# Patient Record
Sex: Female | Born: 1995 | Race: White | Hispanic: No | Marital: Single | Smoking: Current every day smoker
Health system: Southern US, Community
[De-identification: ages and names within clinical notes are randomized; demographics above are authoritative.]

## PROBLEM LIST (undated history)

## (undated) ENCOUNTER — Inpatient Hospital Stay (HOSPITAL_COMMUNITY): Payer: Self-pay

## (undated) DIAGNOSIS — Z789 Other specified health status: Secondary | ICD-10-CM

---

## 2017-04-26 ENCOUNTER — Ambulatory Visit (HOSPITAL_COMMUNITY)
Admission: EM | Admit: 2017-04-26 | Discharge: 2017-04-27 | Disposition: A | Discharge status: Home / Self Care | Payer: Medicaid Other | Attending: Family Medicine | Admitting: Family Medicine

## 2017-04-26 ENCOUNTER — Encounter (HOSPITAL_COMMUNITY): Payer: Self-pay | Admitting: Emergency Medicine

## 2017-04-26 DIAGNOSIS — R9389 Abnormal findings on diagnostic imaging of other specified body structures: Secondary | ICD-10-CM | POA: Insufficient documentation

## 2017-04-26 DIAGNOSIS — O209 Hemorrhage in early pregnancy, unspecified: Secondary | ICD-10-CM

## 2017-04-26 DIAGNOSIS — O071 Delayed or excessive hemorrhage following failed attempted termination of pregnancy: Secondary | ICD-10-CM

## 2017-04-26 DIAGNOSIS — D62 Acute posthemorrhagic anemia: Secondary | ICD-10-CM | POA: Insufficient documentation

## 2017-04-26 DIAGNOSIS — O26892 Other specified pregnancy related conditions, second trimester: Secondary | ICD-10-CM | POA: Insufficient documentation

## 2017-04-26 DIAGNOSIS — IMO0002 Reserved for concepts with insufficient information to code with codable children: Secondary | ICD-10-CM

## 2017-04-26 DIAGNOSIS — O031 Delayed or excessive hemorrhage following incomplete spontaneous abortion: Secondary | ICD-10-CM | POA: Diagnosis not present

## 2017-04-26 DIAGNOSIS — O219 Vomiting of pregnancy, unspecified: Secondary | ICD-10-CM | POA: Diagnosis not present

## 2017-04-26 DIAGNOSIS — F17200 Nicotine dependence, unspecified, uncomplicated: Secondary | ICD-10-CM | POA: Diagnosis not present

## 2017-04-26 DIAGNOSIS — O99332 Smoking (tobacco) complicating pregnancy, second trimester: Secondary | ICD-10-CM | POA: Insufficient documentation

## 2017-04-26 DIAGNOSIS — Z3A18 18 weeks gestation of pregnancy: Secondary | ICD-10-CM | POA: Insufficient documentation

## 2017-04-26 HISTORY — DX: Other specified health status: Z78.9

## 2017-04-26 LAB — COMPREHENSIVE METABOLIC PANEL
ALT: 9 U/L — ABNORMAL LOW (ref 14–54)
AST: 14 U/L — AB (ref 15–41)
Albumin: 3.7 g/dL (ref 3.5–5.0)
Alkaline Phosphatase: 78 U/L (ref 38–126)
Anion gap: 10 (ref 5–15)
BILIRUBIN TOTAL: 0.6 mg/dL (ref 0.3–1.2)
BUN: 14 mg/dL (ref 6–20)
CHLORIDE: 104 mmol/L (ref 101–111)
CO2: 23 mmol/L (ref 22–32)
Calcium: 9.3 mg/dL (ref 8.9–10.3)
Creatinine, Ser: 0.63 mg/dL (ref 0.44–1.00)
GFR calc Af Amer: >60 mL/min (ref >60)
GLUCOSE: 134 mg/dL — AB (ref 65–99)
POTASSIUM: 3.2 mmol/L — AB (ref 3.5–5.1)
Sodium: 137 mmol/L (ref 135–145)
TOTAL PROTEIN: 7.1 g/dL (ref 6.5–8.1)

## 2017-04-26 LAB — CBC
HCT: 22.5 % — ABNORMAL LOW (ref 36.0–46.0)
HEMATOCRIT: 31.4 % — AB (ref 36.0–46.0)
HEMOGLOBIN: 7.3 g/dL — AB (ref 12.0–15.0)
Hemoglobin: 9.9 g/dL — ABNORMAL LOW (ref 12.0–15.0)
MCH: 25.1 pg — ABNORMAL LOW (ref 26.0–34.0)
MCH: 25.7 pg — AB (ref 26.0–34.0)
MCHC: 31.5 g/dL (ref 30.0–36.0)
MCHC: 32.4 g/dL (ref 30.0–36.0)
MCV: 79.5 fL (ref 78.0–100.0)
MCV: 79.2 fL (ref 78.0–100.0)
PLATELETS: 410 10*3/uL — AB (ref 150–400)
Platelets: 290 10*3/uL (ref 150–400)
RBC: 3.95 MIL/uL (ref 3.87–5.11)
RBC: 2.84 MIL/uL — AB (ref 3.87–5.11)
RDW: 16.1 % — AB (ref 11.5–15.5)
RDW: 16.4 % — ABNORMAL HIGH (ref 11.5–15.5)
WBC: 11.3 10*3/uL — AB (ref 4.0–10.5)
WBC: 13.4 10*3/uL — ABNORMAL HIGH (ref 4.0–10.5)

## 2017-04-26 LAB — TYPE AND SCREEN
ABO/RH(D): AB POS
Antibody Screen: NEGATIVE

## 2017-04-26 LAB — I-STAT BETA HCG BLOOD, ED (MC, WL, AP ONLY): I-stat hCG, quantitative: 87.2 m[IU]/mL — ABNORMAL HIGH (ref <5)

## 2017-04-26 MED ORDER — KETOROLAC TROMETHAMINE 30 MG/ML IJ SOLN
30.0000 mg | Freq: Once | INTRAMUSCULAR | Status: AC
  Administered 2017-04-27: 30 mg via INTRAVENOUS
  Filled 2017-04-26: qty 1

## 2017-04-26 MED ORDER — SODIUM CHLORIDE 0.9 % IV BOLUS (SEPSIS)
1000.0000 mL | Freq: Once | INTRAVENOUS | Status: AC
Start: 2017-04-26 — End: 2017-04-26
  Administered 2017-04-26: 1000 mL via INTRAVENOUS

## 2017-04-26 MED ORDER — MORPHINE SULFATE (PF) 4 MG/ML IV SOLN
INTRAVENOUS | Status: AC
  Administered 2017-04-26: 4 mg
  Filled 2017-04-26: qty 3

## 2017-04-26 MED ORDER — OXYCODONE-ACETAMINOPHEN 5-325 MG PO TABS
1.0000 | ORAL_TABLET | ORAL | Status: DC | PRN
  Administered 2017-04-26: 1 via ORAL
  Filled 2017-04-26: qty 1

## 2017-04-26 NOTE — MAU Note (Signed)
[facility] with [facility] bleeding. Pt reports having TAB about 2 weeks prior. States she was about [redacted] weeks pregnant at that time. Pt reports heavy vaginal bleeding started tonight around 8pm and passing large clots the size of baseballs or larger. Pt reports abdominal pain and cramping that she rates 10/10.

## 2017-04-26 NOTE — ED Triage Notes (Signed)
Patient presents to ED for assessment of vaginal bleeding 2 weeks after an elective abortion.  Patient states she had one day of bleeding and then 24 hours after she began bleeding again.  Has been bleeding ever since, patient states today she began having large clots, lower abdominal cramping, and filling the bowl with blood.

## 2017-04-26 NOTE — ED Notes (Signed)
[facility] called per [facility] Kidneyebra, NS

## 2017-04-26 NOTE — MAU Provider Note (Signed)
History     CSN: [id number]  Arrival date and time: 04/26/17 2026   First Provider Initiated Contact with Patient 04/26/17 2324      Chief Complaint  Patient presents with  . Vaginal Bleeding   HPI   Crystal [name] is a 22 y.o. female [id number] here in MAU with heavy vaginal bleeding. States she had an 18 week [facility] [place]. States all went well and has had no problems until tonight. Says she has been bleeding for 2 weeks, however it has been very light.  Tonight she started having heavy bleeding with baseball sized clots. Says she presented to [facility] ED tonight and was transferred here for further evaluation. States the pain is severe, cramp like in her lower abdomen; similar to contraction pain. + dizziness.   OB History    Gravida Para Term Preterm AB Living   '3 2 2 '$ 0 1 2   SAB TAB Ectopic Multiple Live Births   0 1 0 0 2      No past medical history on file.    No family history on file.  Social History   Tobacco Use  . Smoking status: Current Every Day Smoker    Packs/day: 0.50  . Smokeless tobacco: Never Used  Substance Use Topics  . Alcohol use: Yes    Comment: socially  . Drug use: No    Allergies: Not on File  No medications prior to admission.   Results for orders placed or performed during the hospital encounter of 04/26/17 (from the past 48 hour(s))  Type and screen [facility]     Status: None   Collection Time: 04/26/17  8:45 PM  Result Value Ref Range   ABO/RH(D) AB POS    Antibody Screen NEG    Sample Expiration      04/29/2017 Performed at [facility], [place] [address] [place], [place] [address]   ABO/Rh     Status: None (Preliminary result)   Collection Time: 04/26/17  8:45 PM  Result Value Ref Range   ABO/RH(D)      AB POS Performed at [facility] [address] [place], [place] [address]   Comprehensive metabolic panel     Status: Abnormal    Collection Time: 04/26/17  8:48 PM  Result Value Ref Range   Sodium 137 135 - 145 mmol/L   Potassium 3.2 (L) 3.5 - 5.1 mmol/L   Chloride 104 101 - 111 mmol/L   CO2 23 22 - 32 mmol/L   Glucose, Bld 134 (H) 65 - 99 mg/dL   BUN 14 6 - 20 mg/dL   Creatinine, Ser 0.63 0.44 - 1.00 mg/dL   Calcium 9.3 8.9 - 10.3 mg/dL   Total Protein 7.1 6.5 - 8.1 g/dL   Albumin 3.7 3.5 - 5.0 g/dL   AST 14 (L) 15 - 41 U/L   ALT 9 (L) 14 - 54 U/L   Alkaline Phosphatase 78 38 - 126 U/L   Total Bilirubin 0.6 0.3 - 1.2 mg/dL   GFR calc non Af Amer >60 >60 mL/min   GFR calc Af Amer >60 >60 mL/min    Comment: (NOTE) The eGFR has been calculated using the CKD EPI equation. This calculation has not been validated in all clinical situations. eGFR's persistently <60 mL/min signify possible Chronic Kidney Disease.    Anion gap 10 5 - 15    Comment: Performed at [facility], [place] [address] [place], [place] [address]  CBC     Status: Abnormal   Collection Time: 04/26/17  8:48 PM  Result Value Ref Range   WBC 11.3 (H) 4.0 - 10.5 K/uL   RBC 3.95 3.87 - 5.11 MIL/uL   Hemoglobin 9.9 (L) 12.0 - 15.0 g/dL   HCT 31.4 (L) 36.0 - 46.0 %   MCV 79.5 78.0 - 100.0 fL   MCH 25.1 (L) 26.0 - 34.0 pg   MCHC 31.5 30.0 - 36.0 g/dL   RDW 16.1 (H) 11.5 - 15.5 %   Platelets 410 (H) 150 - 400 K/uL    Comment: Performed at [facility] [address] [place], [place] [address]  I-Stat beta hCG blood, ED     Status: Abnormal   Collection Time: 04/26/17  8:59 PM  Result Value Ref Range   I-stat hCG, quantitative 87.2 (H) <5 mIU/mL   Comment 3            Comment:   GEST. AGE      CONC.  (mIU/mL)   <=1 WEEK        5 - 50     2 WEEKS       50 - 500     3 WEEKS       100 - 10,000     4 WEEKS     1,000 - 30,000        FEMALE AND NON-PREGNANT FEMALE:     LESS THAN 5 mIU/mL   CBC     Status: Abnormal   Collection Time: 04/26/17 11:17 PM  Result Value Ref Range   WBC 13.4 (H) 4.0 - 10.5 K/uL   RBC 2.84 (L) 3.87 -  5.11 MIL/uL   Hemoglobin 7.3 (L) 12.0 - 15.0 g/dL   HCT 22.5 (L) 36.0 - 46.0 %   MCV 79.2 78.0 - 100.0 fL   MCH 25.7 (L) 26.0 - 34.0 pg   MCHC 32.4 30.0 - 36.0 g/dL   RDW 16.4 (H) 11.5 - 15.5 %   Platelets 290 150 - 400 K/uL    Comment: Performed at [facility], [address], [place], [place] [address]  hCG, quantitative, pregnancy     Status: Abnormal   Collection Time: 04/26/17 11:17 PM  Result Value Ref Range   hCG, Beta Chain, Quant, S 67 (H) <5 mIU/mL    Comment:          GEST. AGE      CONC.  (mIU/mL)   <=1 WEEK        5 - 50     2 WEEKS       50 - 500     3 WEEKS       100 - 10,000     4 WEEKS     1,000 - 30,000     5 WEEKS     3,500 - 115,000   6-8 WEEKS     12,000 - 270,000    12 WEEKS     15,000 - 220,000        FEMALE AND NON-PREGNANT FEMALE:     LESS THAN 5 mIU/mL Performed at [facility], [address] [place], [place] [address]   Type and screen [place]     Status: None   Collection Time: 04/26/17 11:17 PM  Result Value Ref Range   ABO/RH(D) AB POS    Antibody Screen NEG    Sample Expiration  04/29/2017 Performed at [facility], [address], [place], [place] [address]    Review of Systems  Constitutional: Negative for fever.  Gastrointestinal: Positive for abdominal pain. Negative for nausea and vomiting.  Genitourinary: Positive for vaginal bleeding.  Neurological: Positive for dizziness and light-headedness.   Physical Exam   Blood pressure 96/67, pulse (!) 110, temperature 97.9 F (36.6 C), resp. rate (!) 22, height '5\' 8"'$  (1.727 m), weight 127 lb (57.6 kg), last menstrual period 10/20/2016, SpO2 100 %.   Patient Vitals for the past 24 hrs:  BP Temp Temp src Pulse Resp SpO2 Height Weight  04/27/17 0020 (!) 102/59 - - 94 - - - -  04/27/17 0010 (!) 95/57 - - 93 - - - -  04/27/17 0000 104/90 - - (!) 101 - - - -  04/26/17 2350 (!) 108/48 - - (!) 119 - - - -  04/26/17 2340 (!) 94/57 - - (!) 139 - - - -   04/26/17 2336 (!) 96/59 - - 92 - - - -  04/26/17 2313 96/67 - - (!) 110 - 100 % - -  04/26/17 2310 90/65 97.9 F (36.6 C) - (!) 124 (!) 22 100 % - -  04/26/17 2240 107/63 - - (!) 117 (!) 25 100 % - -  04/26/17 2215 100/70 - - (!) 112 15 100 % - -  04/26/17 2145 106/68 - - (!) 130 16 100 % - -  04/26/17 2115 115/70 98.5 F (36.9 C) Oral (!) 101 13 100 % - -  04/26/17 2033 - - - - - - '5\' 8"'$  (1.727 m) 127 lb (57.6 kg)  04/26/17 2032 108/64 98.2 F (36.8 C) Oral (!) 134 (!) 22 100 % - -    Physical Exam  Constitutional: She is oriented to person, place, and time. She appears well-developed and well-nourished.  Genitourinary:  Genitourinary Comments: Large amount of bright red blood noted on pad Large clots on pad noted   Musculoskeletal: Normal range of motion.  Neurological: She is alert and oriented to person, place, and time.  Skin: Skin is warm and dry. There is pallor.  Psychiatric: Her behavior is normal.   MAU Course  Procedures  None  MDM  AB positive blood type  NS bolus  CBC, Type and screen [place] - bedside  Toradol 60 mg IM given   Assessment and Plan   A:  1. Hemorrhage due to retained products of conception   2. Vaginal bleeding in pregnancy, first trimester     P:  Dr. Kennon Rounds called to MAU to discuss plan of care for the patient.    Lezlie Lye, NP 04/27/2017 12:53 AM

## 2017-04-26 NOTE — MAU Note (Signed)
[facility] for [facility]. Pt had AB 2 weeks ago. Started bleeding heavily tonight.

## 2017-04-26 NOTE — ED Provider Notes (Signed)
WOMENS PERIOPERATIVE AREA Provider Note   CSN: [id number] Arrival date & time: 04/26/17  2026     History   Chief Complaint Chief Complaint  Patient presents with  . Vaginal Bleeding    HPI Crystal [name] is a 22 y.o. female.  HPI    22 year old female with a history of D&C abortion 2 weeks ago at [facility] in [place], presents with concern for vaginal bleeding and cramping.  Patient reports she had some mild bleeding and cramping for the last 2 weeks, similar to her menses, however tonight at 8 PM, suddenly developed worsening cramping and significant bleeding.  Reports she is passing large clots of blood, and has significant pelvic cramping associated with it.  Reports the pain is a 13 out of 10.  Reports there were no known complications to her abortion.  Notes that over the last half hour, she is began to have some lightheadedness.  Denies history of being on blood thinners or history of bleeding disorder.   Past Medical History:  Diagnosis Date  . Medical history non-contributory     Patient Active Problem List   Diagnosis Date Noted  . Hemorrhage due to retained products of conception      OB History    Gravida Para Term Preterm AB Living   3 2 2  0 1 2   SAB TAB Ectopic Multiple Live Births   0 1 0 0 2       Home Medications    Prior to Admission medications   Medication Sig Start Date End Date Taking? Authorizing Provider  doxycycline (VIBRAMYCIN) 100 MG capsule Take 1 capsule (100 mg total) by mouth 2 (two) times daily. 04/27/17   Reva Bores, MD  ibuprofen (ADVIL,MOTRIN) 600 MG tablet Take 1 tablet (600 mg total) by mouth every 6 (six) hours as needed. 04/27/17   Reva Bores, MD    Family History No family history on file.  Social History Social History   Tobacco Use  . Smoking status: Current Every Day Smoker    Packs/day: 0.50  . Smokeless tobacco: Never Used  Substance Use Topics  . Alcohol use: Yes    Comment: socially  .  Drug use: No     Allergies   Patient has no known allergies.   Review of Systems Review of Systems  Unable to perform ROS: Acuity of condition  Constitutional: Negative for fever.  HENT: Negative for sore throat.   Eyes: Negative for visual disturbance.  Respiratory: Negative for cough and shortness of breath.   Cardiovascular: Negative for chest pain.  Gastrointestinal: Positive for abdominal pain and nausea. Negative for vomiting.  Genitourinary: Positive for vaginal bleeding. Negative for difficulty urinating and dysuria.  Musculoskeletal: Negative for back pain and neck pain.  Skin: Negative for rash.  Neurological: Positive for light-headedness. Negative for syncope and headaches.     Physical Exam Updated Vital Signs BP (!) 91/59   Pulse 83   Temp 97.9 F (36.6 C)   Resp 13   Ht 5\' 8"  (1.727 m)   Wt 57.6 kg (127 lb)   LMP 10/20/2016 (Within Weeks)   SpO2 100%   BMI 19.31 kg/m   Physical Exam  Constitutional: She is oriented to person, place, and time. She appears well-developed and well-nourished. No distress.  HENT:  Head: Normocephalic and atraumatic.  Eyes: Conjunctivae and EOM are normal.  Neck: Normal range of motion.  Cardiovascular: Normal rate, regular rhythm, normal heart sounds and intact distal  pulses. Exam reveals no gallop and no friction rub.  No murmur heard. Pulmonary/Chest: Effort normal and breath sounds normal. No respiratory distress. She has no wheezes. She has no rales.  Abdominal: Soft. She exhibits no distension. There is no tenderness. There is no guarding.  Genitourinary: There is bleeding (large clots, continued bleeding, unable to visualize cervix) in the vagina.  Musculoskeletal: She exhibits no edema or tenderness.  Neurological: She is alert and oriented to person, place, and time.  Skin: Skin is warm and dry. No rash noted. She is not diaphoretic. No erythema.  Nursing note and vitals reviewed.    ED Treatments / Results    Labs (all labs ordered are listed, but only abnormal results are displayed) Labs Reviewed  COMPREHENSIVE METABOLIC PANEL - Abnormal; Notable for the following components:      Result Value   Potassium 3.2 (*)    Glucose, Bld 134 (*)    AST 14 (*)    ALT 9 (*)    All other components within normal limits  CBC - Abnormal; Notable for the following components:   WBC 11.3 (*)    Hemoglobin 9.9 (*)    HCT 31.4 (*)    MCH 25.1 (*)    RDW 16.1 (*)    Platelets 410 (*)    All other components within normal limits  CBC - Abnormal; Notable for the following components:   WBC 13.4 (*)    RBC 2.84 (*)    Hemoglobin 7.3 (*)    HCT 22.5 (*)    MCH 25.7 (*)    RDW 16.4 (*)    All other components within normal limits  HCG, QUANTITATIVE, PREGNANCY - Abnormal; Notable for the following components:   hCG, Beta Chain, Quant, S 67 (*)    All other components within normal limits  I-STAT BETA HCG BLOOD, ED ([facility], [facility], [facility] ONLY) - Abnormal; Notable for the following components:   I-stat hCG, quantitative 87.2 (*)    All other components within normal limits  HIV ANTIBODY (ROUTINE TESTING)  CBC  TYPE AND SCREEN  ABO/RH  TYPE AND SCREEN  ABO/RH  SURGICAL PATHOLOGY    EKG  EKG Interpretation None       Radiology US Ob Less Than 14 Weeks With Ob Transvaginal  Result Date: 04/27/2017 CLINICAL DATA:  Status post recent failed pregnancy for 2 weeks with continued vaginal bleeding EXAM: OBSTETRIC <14 WK [place] AND TRANSVAGINAL OB US TECHNIQUE: Both transabdominal and transvaginal ultrasound examinations were performed for complete evaluation of the gestation as well as the maternal uterus, adnexal regions, and pelvic cul-de-sac. Transvaginal technique was performed to assess early pregnancy. COMPARISON:  None. FINDINGS: Intrauterine gestational sac: Not seen Yolk sac:  Not seen Embryo:  Not seen Maternal uterus/adnexae: Bilateral ovaries are within normal limits. Right ovary measures 2 by 2.7 x 1.9  cm. Left ovary measures 2.7 by 2.4 x 1.5 cm. No significant free fluid. Endometrial mass or heterogeneous echogenic thickening measuring 6 x 3.5 cm with hypervascularity. IMPRESSION: 1. No intrauterine pregnancy is visualized. By report, patient is status post TAB. 2. Heterogeneous masslike thickening of the endometrium in the fundus measuring up to 6 cm in thickness with increased vascularity, findings would be consistent with retained products of conception in the appropriate clinical setting. Electronically Signed   By: Jasmine Pang M.D.   On: 04/27/2017 00:57    Procedures .Critical Care Performed by: Alvira Monday, MD Authorized by: Alvira Monday, MD   Critical care provider  statement:    Critical care time (minutes):  30   Critical care was time spent personally by me on the following activities:  Evaluation of patient's response to treatment, discussions with consultants, ordering and review of laboratory studies and re-evaluation of patient's condition Comments:     CRITICAL CARE: vaginal hemorrhage with tachycardia, initiate fluids, pelvic exam, transfer to [facility]  Performed by: Lynnea Ferrier   Total critical care time: 30 minutes  Critical care time was exclusive of separately billable procedures and treating other patients.  Critical care was necessary to treat or prevent imminent or life-threatening deterioration.  Critical care was time spent personally by me on the following activities: development of treatment plan with patient and/or surrogate as well as nursing, discussions with consultants, evaluation of patient's response to treatment, examination of patient, obtaining history from patient or surrogate, ordering and performing treatments and interventions, ordering and review of laboratory studies, ordering and review of radiographic studies, pulse oximetry and re-evaluation of patient's condition.    (including critical care time)  Medications  Ordered in ED Medications  oxyCODONE-acetaminophen (PERCOCET/ROXICET) 5-325 MG per tablet 1 tablet ( Oral MAR Hold 04/27/17 0128)  doxycycline (VIBRAMYCIN) 100 mg in sodium chloride 0.9 % 250 mL IVPB (100 mg Intravenous New Bag/Given 04/27/17 0329)  meperidine (DEMEROL) injection 6.25-12.5 mg (12.5 mg Intravenous Given 04/27/17 0342)  midazolam (VERSED) injection 0.5-2 mg (not administered)  promethazine (PHENERGAN) injection 6.25-12.5 mg (not administered)  fentaNYL (SUBLIMAZE) injection 25-50 mcg (25 mcg Intravenous Given 04/27/17 0405)  meperidine (DEMEROL) 25 MG/ML injection (not administered)  fentaNYL (SUBLIMAZE) 100 MCG/2ML injection (not administered)  sodium chloride 0.9 % bolus 1,000 mL (1,000 mLs Intravenous New Bag/Given 04/26/17 2153)  morphine 4 MG/ML injection (4 mg  Given 04/26/17 2208)  ketorolac (TORADOL) 30 MG/ML injection 30 mg (30 mg Intravenous Given 04/27/17 0015)  sodium citrate-citric acid (ORACIT) solution 30 mL (30 mLs Oral Given 04/27/17 0106)     Initial Impression / Assessment and Plan / ED Course  I have reviewed the triage vital signs and the nursing notes.  Pertinent labs & imaging results that were available during my care of the patient were reviewed by me and considered in my medical decision making (see chart for details).     22 year old female with a history of D&C TAB 2 weeks ago at [facility], presents with concern for vaginal bleeding and cramping.  Patient with significant tachycardia on arrival, upwards of 130, lightheadedness. Blood pressures at this time remain stable.  Hgb down to 9 with no known history of anemia.  Concern for significant hemorrhage based on history, exam, vital signs.  Discussed transfusion however given blood pressure remain stable, will not give at this time and pt given IV saline. Discussed with OBGYN Dr. Shawnie Pons.  Will transfer emergently to [facility] for further care.   Final Clinical Impressions(s) /  ED Diagnoses   Final diagnoses:  Vaginal bleeding in pregnancy, first trimester  Hemorrhage due to retained products of conception    ED Discharge Orders        Ordered    ibuprofen (ADVIL,MOTRIN) 600 MG tablet  Every 6 hours PRN     04/27/17 0319    doxycycline (VIBRAMYCIN) 100 MG capsule  2 times daily     04/27/17 0319    Diet - low sodium heart healthy     04/27/17 0319    Increase activity slowly     04/27/17 0319  Call MD for:  temperature >100.4     04/27/17 0319    Call MD for:  persistant nausea and vomiting     04/27/17 0319    Call MD for:  severe uncontrolled pain     04/27/17 0319    Call MD for:  extreme fatigue     04/27/17 0319    Call MD for:    Comments:  Significant vaginal bleeding   04/27/17 0319       Alvira MondaySchlossman, Joud Pettinato, MD 04/27/17 [phone]

## 2017-04-27 ENCOUNTER — Encounter (HOSPITAL_COMMUNITY): Payer: Self-pay | Admitting: Emergency Medicine

## 2017-04-27 ENCOUNTER — Inpatient Hospital Stay (HOSPITAL_COMMUNITY): Payer: Medicaid Other | Admitting: Certified Registered Nurse Anesthetist

## 2017-04-27 ENCOUNTER — Inpatient Hospital Stay (HOSPITAL_COMMUNITY): Payer: Medicaid Other

## 2017-04-27 ENCOUNTER — Encounter (HOSPITAL_COMMUNITY)
Admission: EM | Disposition: A | Discharge status: Home / Self Care | Payer: Self-pay | Source: Home / Self Care | Attending: Emergency Medicine

## 2017-04-27 DIAGNOSIS — F17200 Nicotine dependence, unspecified, uncomplicated: Secondary | ICD-10-CM | POA: Diagnosis not present

## 2017-04-27 DIAGNOSIS — IMO0002 Reserved for concepts with insufficient information to code with codable children: Secondary | ICD-10-CM

## 2017-04-27 DIAGNOSIS — D62 Acute posthemorrhagic anemia: Secondary | ICD-10-CM | POA: Diagnosis not present

## 2017-04-27 DIAGNOSIS — O219 Vomiting of pregnancy, unspecified: Secondary | ICD-10-CM | POA: Diagnosis not present

## 2017-04-27 DIAGNOSIS — O031 Delayed or excessive hemorrhage following incomplete spontaneous abortion: Secondary | ICD-10-CM | POA: Diagnosis not present

## 2017-04-27 DIAGNOSIS — O071 Delayed or excessive hemorrhage following failed attempted termination of pregnancy: Secondary | ICD-10-CM

## 2017-04-27 HISTORY — PX: DILATION AND EVACUATION: SHX1459

## 2017-04-27 LAB — CBC
HCT: 17.1 % — ABNORMAL LOW (ref 36.0–46.0)
Hemoglobin: 5.7 g/dL — CL (ref 12.0–15.0)
MCH: 26.3 pg (ref 26.0–34.0)
MCHC: 33.3 g/dL (ref 30.0–36.0)
MCV: 78.8 fL (ref 78.0–100.0)
PLATELETS: 204 10*3/uL (ref 150–400)
RBC: 2.17 MIL/uL — ABNORMAL LOW (ref 3.87–5.11)
RDW: 16.4 % — ABNORMAL HIGH (ref 11.5–15.5)
WBC: 8.9 10*3/uL (ref 4.0–10.5)

## 2017-04-27 LAB — HCG, QUANTITATIVE, PREGNANCY: hCG, Beta Chain, Quant, S: 67 m[IU]/mL — ABNORMAL HIGH (ref <5)

## 2017-04-27 LAB — ABO/RH: ABO/RH(D): AB POS

## 2017-04-27 SURGERY — DILATION AND EVACUATION, UTERUS
Anesthesia: General | Site: Vagina

## 2017-04-27 MED ORDER — FENTANYL CITRATE (PF) 100 MCG/2ML IJ SOLN
INTRAMUSCULAR | Status: AC
  Filled 2017-04-27: qty 2

## 2017-04-27 MED ORDER — SUCCINYLCHOLINE CHLORIDE 200 MG/10ML IV SOSY
PREFILLED_SYRINGE | INTRAVENOUS | Status: AC
  Filled 2017-04-27: qty 10

## 2017-04-27 MED ORDER — SOD CITRATE-CITRIC ACID 500-334 MG/5ML PO SOLN
ORAL | Status: AC
  Administered 2017-04-27: 30 mL via ORAL
  Filled 2017-04-27: qty 15

## 2017-04-27 MED ORDER — PROMETHAZINE HCL 25 MG/ML IJ SOLN: 6.2500 mg | INTRAMUSCULAR | Status: DC | PRN

## 2017-04-27 MED ORDER — ONDANSETRON HCL 4 MG/2ML IJ SOLN
INTRAMUSCULAR | Status: DC | PRN
  Administered 2017-04-27: 4 mg via INTRAVENOUS

## 2017-04-27 MED ORDER — SOD CITRATE-CITRIC ACID 500-334 MG/5ML PO SOLN
30.0000 mL | Freq: Once | ORAL | Status: AC
  Administered 2017-04-27: 30 mL via ORAL

## 2017-04-27 MED ORDER — MIDAZOLAM HCL 2 MG/2ML IJ SOLN: 0.5000 mg | Freq: Once | INTRAMUSCULAR | Status: DC | PRN

## 2017-04-27 MED ORDER — LACTATED RINGERS IV SOLN
INTRAVENOUS | Status: DC | PRN
  Administered 2017-04-27: 03:00:00 via INTRAVENOUS

## 2017-04-27 MED ORDER — MEPERIDINE HCL 25 MG/ML IJ SOLN
6.2500 mg | INTRAMUSCULAR | Status: DC | PRN
  Administered 2017-04-27 (×2): 12.5 mg via INTRAVENOUS

## 2017-04-27 MED ORDER — PHENYLEPHRINE HCL 10 MG/ML IJ SOLN
INTRAMUSCULAR | Status: DC | PRN
  Administered 2017-04-27: .4 mg via INTRAVENOUS
  Administered 2017-04-27: .8 mg via INTRAVENOUS

## 2017-04-27 MED ORDER — ACETAMINOPHEN 325 MG PO TABS
650.0000 mg | ORAL_TABLET | Freq: Four times a day (QID) | ORAL | Status: DC | PRN
  Administered 2017-04-27: 650 mg via ORAL

## 2017-04-27 MED ORDER — PROPOFOL 10 MG/ML IV BOLUS
INTRAVENOUS | Status: DC | PRN
  Administered 2017-04-27: 150 mg via INTRAVENOUS

## 2017-04-27 MED ORDER — LIDOCAINE HCL (CARDIAC) 20 MG/ML IV SOLN
INTRAVENOUS | Status: DC | PRN
  Administered 2017-04-27: 50 mg via INTRAVENOUS

## 2017-04-27 MED ORDER — SODIUM CHLORIDE 0.9 % IV SOLN
100.0000 mg | INTRAVENOUS | Status: AC
  Administered 2017-04-27: 100 mg via INTRAVENOUS
  Filled 2017-04-27: qty 100

## 2017-04-27 MED ORDER — MIDAZOLAM HCL 2 MG/2ML IJ SOLN
INTRAMUSCULAR | Status: DC | PRN
  Administered 2017-04-27: 2 mg via INTRAVENOUS

## 2017-04-27 MED ORDER — IBUPROFEN 600 MG PO TABS: 600.0000 mg | ORAL_TABLET | Freq: Four times a day (QID) | ORAL | 1 refills | Status: DC | PRN

## 2017-04-27 MED ORDER — BUPIVACAINE-EPINEPHRINE 0.25% -1:200000 IJ SOLN
INTRAMUSCULAR | Status: DC | PRN
  Administered 2017-04-27: 20 mL

## 2017-04-27 MED ORDER — DOXYCYCLINE HYCLATE 100 MG PO CAPS: 100.0000 mg | ORAL_CAPSULE | Freq: Two times a day (BID) | ORAL | 0 refills | Status: DC

## 2017-04-27 MED ORDER — SCOPOLAMINE 1 MG/3DAYS TD PT72
MEDICATED_PATCH | TRANSDERMAL | Status: AC
  Filled 2017-04-27: qty 1

## 2017-04-27 MED ORDER — FENTANYL CITRATE (PF) 100 MCG/2ML IJ SOLN
25.0000 ug | INTRAMUSCULAR | Status: DC | PRN
  Administered 2017-04-27 (×2): 25 ug via INTRAVENOUS
  Administered 2017-04-27: 50 ug via INTRAVENOUS

## 2017-04-27 MED ORDER — PHENYLEPHRINE 40 MCG/ML (10ML) SYRINGE FOR IV PUSH (FOR BLOOD PRESSURE SUPPORT)
PREFILLED_SYRINGE | INTRAVENOUS | Status: AC
  Filled 2017-04-27: qty 10

## 2017-04-27 MED ORDER — ACETAMINOPHEN 325 MG PO TABS
ORAL_TABLET | ORAL | Status: AC
  Filled 2017-04-27: qty 2

## 2017-04-27 MED ORDER — DEXAMETHASONE SODIUM PHOSPHATE 10 MG/ML IJ SOLN
INTRAMUSCULAR | Status: DC | PRN
  Administered 2017-04-27: 4 mg via INTRAVENOUS

## 2017-04-27 MED ORDER — PROPOFOL 10 MG/ML IV BOLUS
INTRAVENOUS | Status: AC
  Filled 2017-04-27: qty 20

## 2017-04-27 MED ORDER — MIDAZOLAM HCL 2 MG/2ML IJ SOLN
INTRAMUSCULAR | Status: AC
  Filled 2017-04-27: qty 2

## 2017-04-27 MED ORDER — FENTANYL CITRATE (PF) 100 MCG/2ML IJ SOLN
INTRAMUSCULAR | Status: DC | PRN
  Administered 2017-04-27: 100 ug via INTRAVENOUS

## 2017-04-27 MED ORDER — FENTANYL CITRATE (PF) 100 MCG/2ML IJ SOLN
INTRAMUSCULAR | Status: DC
Start: 2017-04-27 — End: 2017-04-27
  Filled 2017-04-27: qty 2

## 2017-04-27 MED ORDER — LIDOCAINE HCL (CARDIAC) 20 MG/ML IV SOLN
INTRAVENOUS | Status: AC
  Filled 2017-04-27: qty 5

## 2017-04-27 MED ORDER — IRON (FERROUS SULFATE) 142 (45 FE) MG PO TBCR: 1.0000 | EXTENDED_RELEASE_TABLET | Freq: Two times a day (BID) | ORAL | 2 refills | Status: DC

## 2017-04-27 MED ORDER — ONDANSETRON HCL 4 MG/2ML IJ SOLN
INTRAMUSCULAR | Status: AC
  Filled 2017-04-27: qty 2

## 2017-04-27 MED ORDER — DEXAMETHASONE SODIUM PHOSPHATE 4 MG/ML IJ SOLN
INTRAMUSCULAR | Status: AC
  Filled 2017-04-27: qty 1

## 2017-04-27 MED ORDER — MEPERIDINE HCL 25 MG/ML IJ SOLN
INTRAMUSCULAR | Status: AC
  Filled 2017-04-27: qty 1

## 2017-04-27 MED ORDER — SODIUM CHLORIDE 0.9 % IV SOLN
INTRAVENOUS | Status: DC | PRN
  Administered 2017-04-27: 02:00:00 via INTRAVENOUS

## 2017-04-27 MED ORDER — SUCCINYLCHOLINE CHLORIDE 20 MG/ML IJ SOLN
INTRAMUSCULAR | Status: DC | PRN
  Administered 2017-04-27: 120 mg via INTRAVENOUS

## 2017-04-27 SURGICAL SUPPLY — 17 items
CATH ROBINSON RED A/P 16FR (CATHETERS) ×2 IMPLANT
DECANTER SPIKE VIAL GLASS SM (MISCELLANEOUS) IMPLANT
GLOVE BIOGEL PI IND STRL 7.0 (GLOVE) ×2 IMPLANT
GLOVE BIOGEL PI INDICATOR 7.0 (GLOVE) ×2
GLOVE ECLIPSE 7.0 STRL STRAW (GLOVE) ×2 IMPLANT
GOWN STRL REUS W/TWL LRG LVL3 (GOWN DISPOSABLE) ×4 IMPLANT
KIT BERKELEY 1ST TRIMESTER 3/8 (MISCELLANEOUS) ×2 IMPLANT
NS IRRIG 1000ML POUR BTL (IV SOLUTION) ×2 IMPLANT
PACK VAGINAL MINOR WOMEN LF (CUSTOM PROCEDURE TRAY) ×2 IMPLANT
PAD OB MATERNITY 4.3X12.25 (PERSONAL CARE ITEMS) ×2 IMPLANT
PAD PREP 24X48 CUFFED NSTRL (MISCELLANEOUS) ×2 IMPLANT
SET BERKELEY SUCTION TUBING (SUCTIONS) ×2 IMPLANT
TOWEL OR 17X24 6PK STRL BLUE (TOWEL DISPOSABLE) ×4 IMPLANT
VACURETTE 10 RIGID CVD (CANNULA) ×2 IMPLANT
VACURETTE 7MM CVD STRL WRAP (CANNULA) IMPLANT
VACURETTE 8 RIGID CVD (CANNULA) IMPLANT
VACURETTE 9 RIGID CVD (CANNULA) IMPLANT

## 2017-04-27 NOTE — Transfer of Care (Signed)
Immediate Anesthesia Transfer of Care Note  Patient: Crystal [name]  Procedure(s) Performed: DILATATION AND EVACUATION (N/A Vagina )  Patient Location: PACU  Anesthesia Type:General  Level of Consciousness: awake, alert , oriented and patient cooperative  Airway & Oxygen Therapy: Patient Spontanous Breathing  Post-op Assessment: Report given to RN, Post -op Vital signs reviewed and stable and Patient moving all extremities X 4  Post vital signs: Reviewed and stable  Last Vitals:  Vitals:   04/27/17 0110 04/27/17 0118  BP: (!) 85/46 (!) 78/47  Pulse: 93 88  Resp:    Temp:    SpO2:  100%    Last Pain:  Vitals:   04/27/17 0015  TempSrc:   PainSc: 9          Complications: No apparent anesthesia complications

## 2017-04-27 NOTE — Anesthesia Preprocedure Evaluation (Addendum)
Anesthesia Evaluation  Patient identified by MRN, date of birth, ID band Patient awake    Reviewed: Allergy & Precautions, NPO status , Patient's Chart, lab work & pertinent test results  History of Anesthesia Complications Negative for: history of anesthetic complications  Airway Mallampati: II  TM Distance: >3 FB Neck ROM: Full    Dental  (+) Poor Dentition, Dental Advisory Given, Chipped   Pulmonary Current Smoker,    breath sounds clear to auscultation       Cardiovascular negative cardio ROS   Rhythm:Regular Rate:Normal     Neuro/Psych negative neurological ROS     GI/Hepatic negative GI ROS, Neg liver ROS,   Endo/Other  negative endocrine ROS  Renal/GU negative Renal ROS     Musculoskeletal   Abdominal   Peds  Hematology  (+) Blood dyscrasia (Hb 7.7), anemia ,   Anesthesia Other Findings   Reproductive/Obstetrics (+) Pregnancy                            Anesthesia Physical Anesthesia Plan  ASA: II  Anesthesia Plan: General   Post-op Pain Management:    Induction: Intravenous and Rapid sequence  PONV Risk Score and Plan: 2 and Ondansetron, Dexamethasone and Scopolamine patch - Pre-op  Airway Management Planned: Oral ETT  Additional Equipment:   Intra-op Plan:   Post-operative Plan: Extubation in OR  Informed Consent: I have reviewed the patients History and Physical, chart, labs and discussed the procedure including the risks, benefits and alternatives for the proposed anesthesia with the patient or authorized representative who has indicated his/her understanding and acceptance.   Dental advisory given  Plan Discussed with: CRNA and Surgeon  Anesthesia Plan Comments: (Plan routine monitors, GETA)        Anesthesia Quick Evaluation

## 2017-04-27 NOTE — Op Note (Signed)
Crystal [name]  PROCEDURE DATE: 04/26/2017 - 04/27/2017  PREOPERATIVE DIAGNOSIS: Retained POC following 18 wk TAB  POSTOPERATIVE DIAGNOSIS: The same.  PROCEDURE:    Suction Dilation and Evacuation.  SURGEON:  Reva Boresanya S Pratt  ANESTHESIA: Jairo BenJackson, Carswell, MD  INDICATIONS: 22 y.o. G3P2012with retained POC following 18 wk TAB. Hgb drop by 3 gms in 3 hours.  Risks of surgery were discussed with the patient including but not limited to: bleeding which may require transfusion; infection which may require antibiotics; injury to uterus or surrounding organs;need for additional procedures including laparotomy or laparoscopy; possibility of intrauterine scarring which may impair future fertility; and other postoperative/anesthesia complications. Written informed consent was obtained.    FINDINGS:  A 10 wk size midline uterus, moderate amounts of products of conception, specimen sent to pathology.  ANESTHESIA:  GETT Jairo Ben- Jackson, Carswell, MD , paracervical block.  ESTIMATED BLOOD LOSS:  Less than 20 ml.  SPECIMENS:  Products of conception sent to pathology  COMPLICATIONS:  None immediate.  PROCEDURE DETAILS:  The patient received intravenous antibiotics while in the preoperative area.  She was then taken to the operating room where general anesthesia was administered and was found to be adequate.  After an adequate timeout was performed, she was placed in the dorsal lithotomy position and examined; then prepped and draped in the sterile manner.   Her bladder was catheterized for an unmeasured amount of clear, yellow urine. A vaginal speculum was then placed in the patient's vagina and a single tooth tenaculum was applied to the anterior lip of the cervix.  A paracervical block using 0.25% Marcaine with Epinephrine was administered. The cervix was gently dilated to accommodate a 10 mm suction curette that was gently advanced to the uterine fundus.  The suction device was then activated and curette slowly  rotated to clear the uterus of retained products of conception.  A sharp curettage was then performed to confirm complete emptying of the uterus.There was minimal bleeding noted and the tenaculum removed with good hemostasis noted. Bimanual massage done to ensure uterine firmness.  All insturmnent, needle and lap counts were correct x 2.The patient tolerated the procedure well.  The patient was taken to the recovery area in stable condition.  Reva Boresanya S Pratt 04/27/2017 3:13 AM

## 2017-04-27 NOTE — Anesthesia Postprocedure Evaluation (Signed)
Anesthesia Post Note  Patient: Crystal [name]  Procedure(s) Performed: DILATATION AND EVACUATION (N/A Vagina )     Patient location during evaluation: PACU Anesthesia Type: General Level of consciousness: awake and alert, patient cooperative and oriented Pain management: pain level controlled Vital Signs Assessment: post-procedure vital signs reviewed and stable Respiratory status: spontaneous breathing, nonlabored ventilation and respiratory function stable Cardiovascular status: blood pressure returned to baseline and stable Postop Assessment: no apparent nausea or vomiting Anesthetic complications: no Comments: Post op Hb 5.3, Dr Shawnie PonsPratt and patient declined blood transfusion    Last Vitals:  Vitals:   04/27/17 0445 04/27/17 0505  BP: (!) 98/59   Pulse: 82   Resp: 12 16  Temp: 36.6 C 36.6 C  SpO2: 100%     Last Pain:  Vitals:   04/27/17 0505  TempSrc:   PainSc: 5    Pain Goal:                 Ayeza Therriault,E. Nosson Wender

## 2017-04-27 NOTE — Progress Notes (Signed)
Patient ID: Crystal [name], female   DOB: 08/10/1995, 22 y.o.   MRN: [id number] Post op Hgb noted to be 5.3. Offered patient blood transfusion and she declined this. Will send in some po iron. Ensure that she is not dizzy prior to discharge.

## 2017-04-27 NOTE — Anesthesia Procedure Notes (Signed)
Procedure Name: Intubation Date/Time: 04/27/2017 1:47 AM Performed by: Sandrea Matte, CRNA Pre-anesthesia Checklist: Patient identified, Emergency Drugs available, Suction available and Patient being monitored Patient Re-evaluated:Patient Re-evaluated prior to induction Oxygen Delivery Method: Circle system utilized Preoxygenation: Pre-oxygenation with 100% oxygen Induction Type: IV induction, Rapid sequence and Cricoid Pressure applied Laryngoscope Size: Mac and 3 Grade View: Grade I Tube type: Oral Tube size: 7.0 mm Number of attempts: 1 Airway Equipment and Method: Stylet

## 2017-04-27 NOTE — Discharge Instructions (Signed)
Dilation and Curettage or Vacuum Curettage, Care After  These instructions give you information about caring for yourself after your procedure. Your doctor may also give you more specific instructions. Call your doctor if you have any problems or questions after your procedure.  Follow these instructions at home:  Activity   · Do not drive or use heavy machinery while taking prescription pain medicine.  · For 24 hours after your procedure, avoid driving.  · Take short walks often, followed by rest periods. Ask your doctor what activities are safe for you. After one or two days, you may be able to return to your normal activities.  · Do not lift anything that is heavier than 10 lb (4.5 kg) until your doctor approves.  · For at least 2 weeks, or as long as told by your doctor:  ? Do not douche.  ? Do not use tampons.  ? Do not have sex.  General instructions   · Take over-the-counter and prescription medicines only as told by your doctor. This is very important if you take blood thinning medicine.  · Do not take baths, swim, or use a hot tub until your doctor approves. Take showers instead of baths.  · Wear compression stockings as told by your doctor.  · It is up to you to get the results of your procedure. Ask your doctor when your results will be ready.  · Keep all follow-up visits as told by your doctor. This is important.  Contact a doctor if:  · You have very bad cramps that get worse or do not get better with medicine.  · You have very bad pain in your belly (abdomen).  · You cannot drink fluids without throwing up (vomiting).  · You get pain in a different part of the area between your belly and thighs (pelvis).  · You have bad-smelling discharge from your vagina.  · You have a rash.  Get help right away if:  · You are bleeding a lot from your vagina. A lot of bleeding means soaking more than one sanitary pad in an hour, for 2 hours in a row.  · You have clumps of blood (blood clots) coming from your  vagina.  · You have a fever or chills.  · Your belly feels very tender or hard.  · You have chest pain.  · You have trouble breathing.  · You cough up blood.  · You feel dizzy.  · You feel light-headed.  · You pass out (faint).  · You have pain in your neck or shoulder area.  Summary  · Take short walks often, followed by rest periods. Ask your doctor what activities are safe for you. After one or two days, you may be able to return to your normal activities.  · Do not lift anything that is heavier than 10 lb (4.5 kg) until your doctor approves.  · Do not take baths, swim, or use a hot tub until your doctor approves. Take showers instead of baths.  · Contact your doctor if you have any symptoms of infection, like bad-smelling discharge from your vagina.  This information is not intended to replace advice given to you by your health care provider. Make sure you discuss any questions you have with your health care provider.  Document Released: 11/14/2007 Document Revised: 10/23/2015 Document Reviewed: 10/23/2015  Elsevier Interactive Patient Education © 2017 [employer]

## 2017-04-27 NOTE — H&P (Addendum)
Chief Complaint  Patient presents with Vaginal Bleeding   HPI  [name] is a 22 y.o. female [id number] here in MAU with heavy vaginal bleeding. States she [facility] 18 week [facility] [place]. States all went well and has had no problems until tonight. Says she has been bleeding for 2 weeks, however it has been very light. Tonight she started having heavy bleeding with baseball sized clots. Says she presented to [facility] ED tonight and was transferred here for further evaluation. States the pain is severe, cramp like in her lower abdomen; similar to contraction pain. + dizziness.          OB History     Gravida Para Term Preterm AB Living   3 2 2  0 1 2    SAB TAB Ectopic Multiple Live Births   0 1 0 0 2      No past medical history on file.  No family history on file.  Social History        Tobacco Use  . Smoking status: Current Every Day Smoker    Packs/day: 0.50  . Smokeless tobacco: Never Used  Substance Use Topics  . Alcohol use: Yes    Comment: socially  . Drug use: No   Allergies: Not on File  No medications prior to admission.    Review of Systems  Constitutional: Negative for fever.  Gastrointestinal: Positive for abdominal pain. Negative for nausea and vomiting.  Genitourinary: Positive for vaginal bleeding.  Neurological: Positive for dizziness and light-headedness.   Physical Exam  Blood pressure 96/67, pulse (!) 110, temperature 97.9 F (36.6 C), resp. rate (!) 22, height 5\' 8"  (1.727 m), weight 127 lb (57.6 kg), last menstrual period 10/20/2016, SpO2 100 %.  Patient Vitals for the past 24 hrs:   BP Temp Temp src Pulse Resp SpO2 Height Weight  04/27/17 0020 (!) 102/59 - - 94 - - - -  04/27/17 0010 (!) 95/57 - - 93 - - - -  04/27/17 0000 104/90 - - (!) 101 - - - -  04/26/17 2350 (!) 108/48 - - (!) 119 - - - -  04/26/17 2340 (!) 94/57 - - (!) 139 - - - -  04/26/17 2336 (!) 96/59 - - 92 - - - -  04/26/17 2313  96/67 - - (!) 110 - 100 % - -  04/26/17 2310 90/65 97.9 F (36.6 C) - (!) 124 (!) 22 100 % - -  04/26/17 2240 107/63 - - (!) 117 (!) 25 100 % - -  04/26/17 2215 100/70 - - (!) 112 15 100 % - -  04/26/17 2145 106/68 - - (!) 130 16 100 % - -  04/26/17 2115 115/70 98.5 F (36.9 C) Oral (!) 101 13 100 % - -  04/26/17 2033 - - - - - - 5\' 8"  (1.727 m) 127 lb (57.6 kg)  04/26/17 2032 108/64 98.2 F (36.8 C) Oral (!) 134 (!) 22 100 % - -   Physical Exam  Constitutional: She is oriented to person, place, and time. She appears well-developed and well-nourished.  Genitourinary:  Genitourinary Comments: Large amount of bright red blood noted on pad Large clots on pad noted  Musculoskeletal: Normal range of motion.  Neurological: She is alert and oriented to person, place, and time.  Skin: Skin is warm and dry. There is pallor.  Psychiatric: Her behavior is normal.   U/s reveals large amount of retained  POC Hgb drop x 2 gms in 3 hours Assessment and Plan  Tachycardia, significant retained POC Significant drop in Hgb, Acute blood loss anemia-->will move to OR. Risks include but are not limited to bleeding, infection, injury to surrounding structures, including bowel, bladder and ureters, blood clots, and death.  Likelihood of success is high.

## 2017-04-28 ENCOUNTER — Telehealth: Payer: Self-pay | Admitting: *Deleted

## 2017-04-28 ENCOUNTER — Encounter (HOSPITAL_COMMUNITY): Payer: Self-pay | Admitting: Family Medicine

## 2017-04-28 LAB — ABO/RH: ABO/RH(D): AB POS

## 2017-04-28 NOTE — Telephone Encounter (Signed)
Patient called front desk and was transferred to me. She had a D&C yesterday. Stated she received [facility] and then got toradol and "a bunch" of fentanyl in addition to general anesthesia. This morning she stated she woke up with her face really swollen. She wants to know if this is some kind of reaction to the medicine she received. After conferring with Dr Debroah LoopArnold I told the patient that she should watch the swelling and if it gets worse or she starts having difficulty breathing she should go to MAU or urgent care to be evaluated. Otherwise the swelling should resolve on its own. Patient voiced understanding.

## 2017-04-29 LAB — HIV ANTIBODY (ROUTINE TESTING W REFLEX): HIV SCREEN 4TH GENERATION: NONREACTIVE

## 2017-04-30 LAB — TYPE AND SCREEN
ABO/RH(D): AB POS
ANTIBODY SCREEN: NEGATIVE
Unit division: 0
Unit division: 0

## 2017-04-30 LAB — BPAM RBC
BLOOD PRODUCT EXPIRATION DATE: 201903202359
Blood Product Expiration Date: 201903212359
Unit Type and Rh: 1700
Unit Type and Rh: 1700

## 2017-05-22 ENCOUNTER — Encounter: Payer: Self-pay | Admitting: General Practice

## 2017-05-22 ENCOUNTER — Ambulatory Visit: Payer: Medicaid Other | Admitting: Family Medicine

## 2017-05-22 ENCOUNTER — Encounter: Payer: Self-pay | Admitting: Family Medicine

## 2017-05-22 NOTE — Progress Notes (Signed)
Patient did not keep appointment today. She may call to reschedule.  

## 2017-05-22 NOTE — Progress Notes (Unsigned)
Patient no showed for appt today, per Dr Shawnie PonsPratt patient can reschedule appt on her own.

## 2017-12-17 ENCOUNTER — Emergency Department (HOSPITAL_COMMUNITY)
Admission: EM | Admit: 2017-12-17 | Discharge: 2017-12-17 | Disposition: A | Discharge status: Home / Self Care | Payer: Medicaid Other | Attending: Emergency Medicine | Admitting: Emergency Medicine

## 2017-12-17 ENCOUNTER — Encounter: Payer: Self-pay | Admitting: Emergency Medicine

## 2017-12-17 DIAGNOSIS — Z3A10 10 weeks gestation of pregnancy: Secondary | ICD-10-CM | POA: Insufficient documentation

## 2017-12-17 DIAGNOSIS — O219 Vomiting of pregnancy, unspecified: Secondary | ICD-10-CM | POA: Diagnosis present

## 2017-12-17 DIAGNOSIS — Z5321 Procedure and treatment not carried out due to patient leaving prior to being seen by health care provider: Secondary | ICD-10-CM | POA: Insufficient documentation

## 2017-12-17 MED ORDER — ONDANSETRON 4 MG PO TBDP
4.0000 mg | ORAL_TABLET | Freq: Once | ORAL | Status: AC
  Administered 2017-12-17: 4 mg via ORAL
  Filled 2017-12-17: qty 1

## 2017-12-17 NOTE — ED Provider Notes (Signed)
Patient placed in Quick Look pathway, seen and evaluated   Chief Complaint: vomiting and pregnancy  HPI:   Pt saw her MD today.  Pt report she was suppose to get an rx for zofran but pharmacy reports it was not sent in.   ROS: no fever no chills  Physical Exam:   Gen: No distress  Neuro: Awake and Alert  Skin: Warm    Focused Exam: heart tachy    Initiation of care has begun. The patient has been counseled on the process, plan, and necessity for staying for the completion/evaluation, and the remainder of the medical screening examination   Crystal [name] 12/17/17 1913    Virgina Norfolk, DO 12/20/17 0202

## 2017-12-17 NOTE — ED Notes (Signed)
No answer x 1 by tech first

## 2017-12-17 NOTE — ED Triage Notes (Signed)
Pt presents with vomiting today, 10 wks preg; G4P2

## 2017-12-17 NOTE — ED Notes (Signed)
Called x2 by lobby tech, no answer

## 2018-02-18 NOTE — L&D Delivery Note (Signed)
Patient was C/C/5+2 and pushed for 5 minutes with epidural.    NSVD  female infant, Apgars 9,9, weight P.   The patient had no bleeding lacerations- only bilateral labial "skid marks". Fundus was firm. EBL was expected amount. Placenta was delivered intact. Vagina was clear.  Delayed cord clamping done for 30-60 seconds while warming baby. Baby was vigorous and doing skin to skin with mother.  Crystal [name]

## 2018-03-06 ENCOUNTER — Encounter (HOSPITAL_COMMUNITY): Payer: Self-pay | Admitting: *Deleted

## 2018-03-06 ENCOUNTER — Inpatient Hospital Stay (HOSPITAL_BASED_OUTPATIENT_CLINIC_OR_DEPARTMENT_OTHER): Payer: Medicaid Other

## 2018-03-06 ENCOUNTER — Other Ambulatory Visit: Payer: Self-pay

## 2018-03-06 ENCOUNTER — Inpatient Hospital Stay (HOSPITAL_COMMUNITY)
Admission: AD | Admit: 2018-03-06 | Discharge: 2018-03-06 | Disposition: A | Discharge status: Home / Self Care | Payer: Medicaid Other | Attending: Obstetrics and Gynecology | Admitting: Obstetrics and Gynecology

## 2018-03-06 DIAGNOSIS — O4692 Antepartum hemorrhage, unspecified, second trimester: Secondary | ICD-10-CM | POA: Diagnosis not present

## 2018-03-06 DIAGNOSIS — F17210 Nicotine dependence, cigarettes, uncomplicated: Secondary | ICD-10-CM | POA: Diagnosis not present

## 2018-03-06 DIAGNOSIS — O26852 Spotting complicating pregnancy, second trimester: Secondary | ICD-10-CM

## 2018-03-06 DIAGNOSIS — O4412 Placenta previa with hemorrhage, second trimester: Secondary | ICD-10-CM | POA: Diagnosis not present

## 2018-03-06 DIAGNOSIS — Z3A22 22 weeks gestation of pregnancy: Secondary | ICD-10-CM

## 2018-03-06 DIAGNOSIS — O26892 Other specified pregnancy related conditions, second trimester: Secondary | ICD-10-CM

## 2018-03-06 DIAGNOSIS — O4402 Placenta previa specified as without hemorrhage, second trimester: Secondary | ICD-10-CM

## 2018-03-06 DIAGNOSIS — R109 Unspecified abdominal pain: Secondary | ICD-10-CM

## 2018-03-06 DIAGNOSIS — O99332 Smoking (tobacco) complicating pregnancy, second trimester: Secondary | ICD-10-CM | POA: Diagnosis not present

## 2018-03-06 LAB — WET PREP, GENITAL
Clue Cells Wet Prep HPF POC: NONE SEEN
SPERM: NONE SEEN
Trich, Wet Prep: NONE SEEN
Yeast Wet Prep HPF POC: NONE SEEN

## 2018-03-06 NOTE — MAU Provider Note (Signed)
Chief Complaint: Vaginal Bleeding and Vaginal Pain   First Provider Initiated Contact with Patient 03/06/18 1400      SUBJECTIVE HPI: Crystal [name] is a 23 y.o. [id number] at [redacted]w[redacted]d who presents to maternity admissions reporting onset of light red spotting and intermittent cramping 2 days ago.  She has a previa, noted on [place] in the office, last [place] 2 weeks ago.  She denies recent intercourse or change in activity. The bleeding is light, pink/light red, and only occurs in the morning, associated with some abdominal cramping. After she is up and out of bed the cramping and bleeding resolve. There are no other symptoms. She has not tried any treatments.  She is feeling fetal movement.   HPI  Past Medical History:  Diagnosis Date  . Medical history non-contributory    Past Surgical History:  Procedure Laterality Date  . DILATION AND EVACUATION N/A 04/27/2017   Procedure: DILATATION AND EVACUATION;  Surgeon: Reva Bores, MD;  Location: [facility] ORS;  Service: Gynecology;  Laterality: N/A;   Social History   Socioeconomic History  . Marital status: Single    Spouse name: Not on file  . Number of children: Not on file  . Years of education: Not on file  . Highest education level: Not on file  Occupational History  . Not on file  Social Needs  . Financial resource strain: Not on file  . Food insecurity:    Worry: Not on file    Inability: Not on file  . Transportation needs:    Medical: Not on file    Non-medical: Not on file  Tobacco Use  . Smoking status: Current Every Day Smoker    Packs/day: 0.50  . Smokeless tobacco: Never Used  Substance and Sexual Activity  . Alcohol use: Yes    Comment: socially  . Drug use: No  . Sexual activity: Not Currently  Lifestyle  . Physical activity:    Days per week: Not on file    Minutes per session: Not on file  . Stress: Not on file  Relationships  . Social connections:    Talks on phone: Not on file    Gets together: Not on file   Attends religious service: Not on file    Active member of club or organization: Not on file    Attends meetings of clubs or organizations: Not on file    Relationship status: Not on file  . Intimate partner violence:    Fear of current or ex partner: Not on file    Emotionally abused: Not on file    Physically abused: Not on file    Forced sexual activity: Not on file  Other Topics Concern  . Not on file  Social History Narrative  . Not on file   No current facility-administered medications on file prior to encounter.    Current Outpatient Medications on File Prior to Encounter  Medication Sig Dispense Refill  . Iron, Ferrous Sulfate, 142 (45 Fe) MG TBCR Take 1 tablet by mouth 2 (two) times daily. 180 tablet 2   No Known Allergies  ROS:  Review of Systems  Constitutional: Negative for chills, fatigue and fever.  Eyes: Negative for visual disturbance.  Respiratory: Negative for shortness of breath.   Cardiovascular: Negative for chest pain.  Gastrointestinal: Positive for abdominal pain. Negative for nausea and vomiting.  Genitourinary: Positive for pelvic pain and vaginal bleeding. Negative for difficulty urinating, dysuria, flank pain, vaginal discharge and vaginal pain.  Neurological: Negative  for dizziness and headaches.  Psychiatric/Behavioral: Negative.      I have reviewed patient's Past Medical Hx, Surgical Hx, Family Hx, Social Hx, medications and allergies.   Physical Exam   Patient Vitals for the past 24 hrs:  BP Temp Temp src Pulse Resp SpO2 Weight  03/06/18 1631 (!) 108/57 - - 83 - - -  03/06/18 1320 (!) 104/59 98.6 F (37 C) Oral 100 16 100 % 61.2 kg   Constitutional: Well-developed, well-nourished female in no acute distress.  Cardiovascular: normal rate Respiratory: normal effort GI: Abd soft, non-tender. Pos BS x 4 MS: Extremities nontender, no edema, normal ROM Neurologic: Alert and oriented x 4.  GU: Neg CVAT.  PELVIC EXAM: Cervix pink, visually  closed, without lesion, scant white creamy discharge, vaginal walls and external genitalia normal Bimanual exam: Cervix 0/long/high, firm, anterior, neg CMT, uterus nontender, nonenlarged, adnexa without tenderness, enlargement, or mass  FHT 152 by doppler  LAB RESULTS Results for orders placed or performed during the hospital encounter of 03/06/18 (from the past 24 hour(s))  Wet prep, genital     Status: Abnormal   Collection Time: 03/06/18  3:34 PM  Result Value Ref Range   Yeast Wet Prep HPF POC NONE SEEN NONE SEEN   Trich, Wet Prep NONE SEEN NONE SEEN   Clue Cells Wet Prep HPF POC NONE SEEN NONE SEEN   WBC, Wet Prep HPF POC MANY (A) NONE SEEN   Sperm NONE SEEN     --/--/AB POS, AB POS Performed at [facility], [address] [place], [place] [address]  (03/09 2317)  IMAGING No results found.  Limited OB US by MFM: Preliminary report and review of US images reveals previa resolved and above cervical os, with no apparent abruption noted   MAU Management/MDM: Orders Placed This Encounter  Procedures  . Wet prep, genital  . US MFM OB LIMITED  . Discharge patient    No orders of the defined types were placed in this encounter.   With US confirming resolved placenta previa, pelvic exam performed and wnl without evidence of bleeding. Wet prep wnl.  GCC pending.  F/U in office.  Return to MAU for signs of labor or emergencies.  Pt discharged with strict return precautions.  ASSESSMENT 1. Spotting affecting pregnancy in second trimester   2. Vaginal bleeding in pregnancy, second trimester   3. Placenta previa antepartum, second trimester   4. Abdominal pain during pregnancy in second trimester     PLAN Discharge home Allergies as of 03/06/2018   No Known Allergies     Medication List    STOP taking these medications   doxycycline 100 MG capsule Commonly known as:  VIBRAMYCIN   ibuprofen 600 MG tablet Commonly known as:  ADVIL,MOTRIN     TAKE these  medications   Iron (Ferrous Sulfate) 142 (45 Fe) MG Tbcr Take 1 tablet by mouth 2 (two) times daily.      Follow-up Information    [facility].   Why:  As scheduled, return to MAU as needed for emergencies. Contact information: [address] [place] [address] [phone]           Sharen CounterLisa Leftwich-Kirby Certified Nurse-Midwife 03/06/2018  7:47 PM

## 2018-03-06 NOTE — MAU Note (Signed)
Urine sent to lab 

## 2018-03-06 NOTE — MAU Note (Addendum)
Was having some spotting and vag pain. (the pain happens every morning) Has a complete previa, on call dr told her to come up here.decreased movement.  This is her first episode of bleeding.

## 2018-03-09 LAB — GC/CHLAMYDIA PROBE AMP (~~LOC~~) NOT AT ARMC
Chlamydia: NEGATIVE
NEISSERIA GONORRHEA: NEGATIVE

## 2018-04-28 ENCOUNTER — Ambulatory Visit (HOSPITAL_COMMUNITY)
Admission: RE | Admit: 2018-04-28 | Discharge: 2018-04-28 | Disposition: A | Discharge status: Home / Self Care | Payer: Medicaid Other | Source: Ambulatory Visit | Attending: Obstetrics and Gynecology | Admitting: Obstetrics and Gynecology

## 2018-04-28 DIAGNOSIS — D649 Anemia, unspecified: Secondary | ICD-10-CM | POA: Insufficient documentation

## 2018-04-28 MED ORDER — SODIUM CHLORIDE 0.9 % IV SOLN
510.0000 mg | INTRAVENOUS | Status: DC
  Administered 2018-04-28: 09:00:00 510 mg via INTRAVENOUS
  Filled 2018-04-28: qty 510

## 2018-04-28 NOTE — Discharge Instructions (Signed)

## 2018-05-04 ENCOUNTER — Other Ambulatory Visit (HOSPITAL_COMMUNITY): Payer: Self-pay | Admitting: *Deleted

## 2018-05-05 ENCOUNTER — Encounter (HOSPITAL_COMMUNITY)
Admission: RE | Admit: 2018-05-05 | Discharge: 2018-05-05 | Disposition: A | Discharge status: Home / Self Care | Payer: Medicaid Other | Source: Ambulatory Visit | Attending: Obstetrics and Gynecology | Admitting: Obstetrics and Gynecology

## 2018-05-05 DIAGNOSIS — D649 Anemia, unspecified: Secondary | ICD-10-CM | POA: Diagnosis not present

## 2018-05-05 MED ORDER — SODIUM CHLORIDE 0.9 % IV SOLN
510.0000 mg | INTRAVENOUS | Status: AC
  Administered 2018-05-05: 510 mg via INTRAVENOUS
  Filled 2018-05-05: qty 510

## 2018-07-02 ENCOUNTER — Other Ambulatory Visit: Payer: Self-pay | Admitting: Obstetrics and Gynecology

## 2018-07-03 ENCOUNTER — Inpatient Hospital Stay (HOSPITAL_COMMUNITY): Payer: Medicaid Other | Admitting: Anesthesiology

## 2018-07-03 ENCOUNTER — Encounter (HOSPITAL_COMMUNITY): Payer: Self-pay | Admitting: Anesthesiology

## 2018-07-03 ENCOUNTER — Encounter (HOSPITAL_COMMUNITY): Payer: Self-pay | Admitting: *Deleted

## 2018-07-03 ENCOUNTER — Inpatient Hospital Stay (HOSPITAL_COMMUNITY)
Admission: AD | Admit: 2018-07-03 | Discharge: 2018-07-05 | DRG: 807 | Disposition: A | Discharge status: Home / Self Care | Payer: Medicaid Other | Attending: Obstetrics and Gynecology | Admitting: Obstetrics and Gynecology

## 2018-07-03 DIAGNOSIS — D649 Anemia, unspecified: Secondary | ICD-10-CM | POA: Diagnosis present

## 2018-07-03 DIAGNOSIS — O99334 Smoking (tobacco) complicating childbirth: Secondary | ICD-10-CM | POA: Diagnosis present

## 2018-07-03 DIAGNOSIS — F17210 Nicotine dependence, cigarettes, uncomplicated: Secondary | ICD-10-CM | POA: Diagnosis present

## 2018-07-03 DIAGNOSIS — O368190 Decreased fetal movements, unspecified trimester, not applicable or unspecified: Secondary | ICD-10-CM | POA: Diagnosis present

## 2018-07-03 DIAGNOSIS — Z1159 Encounter for screening for other viral diseases: Secondary | ICD-10-CM | POA: Diagnosis not present

## 2018-07-03 DIAGNOSIS — Z3A39 39 weeks gestation of pregnancy: Secondary | ICD-10-CM | POA: Diagnosis not present

## 2018-07-03 DIAGNOSIS — O26893 Other specified pregnancy related conditions, third trimester: Secondary | ICD-10-CM | POA: Diagnosis present

## 2018-07-03 DIAGNOSIS — O9902 Anemia complicating childbirth: Principal | ICD-10-CM | POA: Diagnosis present

## 2018-07-03 LAB — OB RESULTS CONSOLE GC/CHLAMYDIA
Chlamydia: NEGATIVE
Gonorrhea: NEGATIVE

## 2018-07-03 LAB — SARS CORONAVIRUS 2 BY RT PCR (HOSPITAL ORDER, PERFORMED IN ~~LOC~~ HOSPITAL LAB): SARS Coronavirus 2: NEGATIVE

## 2018-07-03 LAB — OB RESULTS CONSOLE ANTIBODY SCREEN: Antibody Screen: NEGATIVE

## 2018-07-03 LAB — TYPE AND SCREEN
ABO/RH(D): AB POS
Antibody Screen: NEGATIVE

## 2018-07-03 LAB — CBC
HCT: 34.2 % — ABNORMAL LOW (ref 36.0–46.0)
Hemoglobin: 11.4 g/dL — ABNORMAL LOW (ref 12.0–15.0)
MCH: 28.3 pg (ref 26.0–34.0)
MCHC: 33.3 g/dL (ref 30.0–36.0)
MCV: 84.9 fL (ref 80.0–100.0)
Platelets: 222 10*3/uL (ref 150–400)
RBC: 4.03 MIL/uL (ref 3.87–5.11)
WBC: 11.8 10*3/uL — ABNORMAL HIGH (ref 4.0–10.5)
nRBC: 0 % (ref 0.0–0.2)

## 2018-07-03 LAB — OB RESULTS CONSOLE RPR: RPR: NONREACTIVE

## 2018-07-03 LAB — OB RESULTS CONSOLE GBS: GBS: NEGATIVE

## 2018-07-03 LAB — OB RESULTS CONSOLE ABO/RH: RH Type: POSITIVE

## 2018-07-03 LAB — OB RESULTS CONSOLE HEPATITIS B SURFACE ANTIGEN: Hepatitis B Surface Ag: NEGATIVE

## 2018-07-03 LAB — OB RESULTS CONSOLE HIV ANTIBODY (ROUTINE TESTING): HIV: NONREACTIVE

## 2018-07-03 LAB — OB RESULTS CONSOLE RUBELLA ANTIBODY, IGM: Rubella: NON-IMMUNE/NOT IMMUNE

## 2018-07-03 MED ORDER — ONDANSETRON HCL 4 MG/2ML IJ SOLN: 4.0000 mg | INTRAMUSCULAR | Status: DC | PRN

## 2018-07-03 MED ORDER — BENZOCAINE-MENTHOL 20-0.5 % EX AERO
1.0000 "application " | INHALATION_SPRAY | CUTANEOUS | Status: DC | PRN
  Administered 2018-07-03: 1 via TOPICAL
  Filled 2018-07-03: qty 56

## 2018-07-03 MED ORDER — LACTATED RINGERS IV SOLN
INTRAVENOUS | Status: DC
  Administered 2018-07-03: 09:00:00 via INTRAVENOUS

## 2018-07-03 MED ORDER — SOD CITRATE-CITRIC ACID 500-334 MG/5ML PO SOLN: 30.0000 mL | ORAL | Status: DC | PRN

## 2018-07-03 MED ORDER — PHENYLEPHRINE 40 MCG/ML (10ML) SYRINGE FOR IV PUSH (FOR BLOOD PRESSURE SUPPORT): 80.0000 ug | PREFILLED_SYRINGE | INTRAVENOUS | Status: DC | PRN

## 2018-07-03 MED ORDER — METHYLERGONOVINE MALEATE 0.2 MG/ML IJ SOLN: 0.2000 mg | INTRAMUSCULAR | Status: DC | PRN

## 2018-07-03 MED ORDER — OXYTOCIN BOLUS FROM INFUSION
500.0000 mL | Freq: Once | INTRAVENOUS | Status: AC
  Administered 2018-07-03: 11:00:00 500 mL via INTRAVENOUS

## 2018-07-03 MED ORDER — SODIUM CHLORIDE 0.9% FLUSH: 3.0000 mL | Freq: Two times a day (BID) | INTRAVENOUS | Status: DC

## 2018-07-03 MED ORDER — DIPHENHYDRAMINE HCL 50 MG/ML IJ SOLN: 12.5000 mg | INTRAMUSCULAR | Status: DC | PRN

## 2018-07-03 MED ORDER — SENNOSIDES-DOCUSATE SODIUM 8.6-50 MG PO TABS
2.0000 | ORAL_TABLET | ORAL | Status: DC
  Filled 2018-07-03 (×2): qty 2

## 2018-07-03 MED ORDER — EPHEDRINE 5 MG/ML INJ: 10.0000 mg | INTRAVENOUS | Status: DC | PRN

## 2018-07-03 MED ORDER — MEASLES, MUMPS & RUBELLA VAC IJ SOLR
0.5000 mL | Freq: Once | INTRAMUSCULAR | Status: AC
  Administered 2018-07-05: 12:00:00 0.5 mL via SUBCUTANEOUS
  Filled 2018-07-03: qty 0.5

## 2018-07-03 MED ORDER — DIBUCAINE (PERIANAL) 1 % EX OINT: 1.0000 "application " | TOPICAL_OINTMENT | CUTANEOUS | Status: DC | PRN

## 2018-07-03 MED ORDER — LIDOCAINE HCL (PF) 1 % IJ SOLN: 30.0000 mL | INTRAMUSCULAR | Status: DC | PRN

## 2018-07-03 MED ORDER — FENTANYL-BUPIVACAINE-NACL 0.5-0.125-0.9 MG/250ML-% EP SOLN
12.0000 mL/h | EPIDURAL | Status: DC | PRN
  Filled 2018-07-03: qty 250

## 2018-07-03 MED ORDER — ZOLPIDEM TARTRATE 5 MG PO TABS: 5.0000 mg | ORAL_TABLET | Freq: Every evening | ORAL | Status: DC | PRN

## 2018-07-03 MED ORDER — SODIUM CHLORIDE 0.9% FLUSH: 3.0000 mL | INTRAVENOUS | Status: DC | PRN

## 2018-07-03 MED ORDER — LACTATED RINGERS IV SOLN: 500.0000 mL | INTRAVENOUS | Status: DC | PRN

## 2018-07-03 MED ORDER — SIMETHICONE 80 MG PO CHEW: 80.0000 mg | CHEWABLE_TABLET | ORAL | Status: DC | PRN

## 2018-07-03 MED ORDER — OXYCODONE-ACETAMINOPHEN 5-325 MG PO TABS
2.0000 | ORAL_TABLET | ORAL | Status: DC | PRN
  Administered 2018-07-04: 03:00:00 2 via ORAL
  Filled 2018-07-03: qty 2

## 2018-07-03 MED ORDER — DIPHENHYDRAMINE HCL 25 MG PO CAPS: 25.0000 mg | ORAL_CAPSULE | Freq: Four times a day (QID) | ORAL | Status: DC | PRN

## 2018-07-03 MED ORDER — ACETAMINOPHEN 325 MG PO TABS
650.0000 mg | ORAL_TABLET | ORAL | Status: DC | PRN
  Administered 2018-07-03: 18:00:00 650 mg via ORAL
  Filled 2018-07-03: qty 2

## 2018-07-03 MED ORDER — FERROUS SULFATE 325 (65 FE) MG PO TABS
325.0000 mg | ORAL_TABLET | Freq: Two times a day (BID) | ORAL | Status: DC
  Administered 2018-07-03 – 2018-07-05 (×4): 325 mg via ORAL
  Filled 2018-07-03 (×4): qty 1

## 2018-07-03 MED ORDER — PRENATAL MULTIVITAMIN CH
1.0000 | ORAL_TABLET | Freq: Every day | ORAL | Status: DC
  Administered 2018-07-04 – 2018-07-05 (×2): 1 via ORAL
  Filled 2018-07-03 (×2): qty 1

## 2018-07-03 MED ORDER — ONDANSETRON HCL 4 MG/2ML IJ SOLN: 4.0000 mg | Freq: Four times a day (QID) | INTRAMUSCULAR | Status: DC | PRN

## 2018-07-03 MED ORDER — SODIUM CHLORIDE (PF) 0.9 % IJ SOLN
INTRAMUSCULAR | Status: DC | PRN
  Administered 2018-07-03: 12 mL/h via EPIDURAL

## 2018-07-03 MED ORDER — METHYLERGONOVINE MALEATE 0.2 MG PO TABS: 0.2000 mg | ORAL_TABLET | ORAL | Status: DC | PRN

## 2018-07-03 MED ORDER — MAGNESIUM HYDROXIDE 400 MG/5ML PO SUSP: 30.0000 mL | ORAL | Status: DC | PRN

## 2018-07-03 MED ORDER — SODIUM CHLORIDE 0.9 % IV SOLN: 250.0000 mL | INTRAVENOUS | Status: DC | PRN

## 2018-07-03 MED ORDER — ACETAMINOPHEN 325 MG PO TABS: 650.0000 mg | ORAL_TABLET | ORAL | Status: DC | PRN

## 2018-07-03 MED ORDER — LIDOCAINE HCL (PF) 1 % IJ SOLN
INTRAMUSCULAR | Status: DC | PRN
  Administered 2018-07-03: 6 mL via EPIDURAL

## 2018-07-03 MED ORDER — OXYTOCIN 40 UNITS IN NORMAL SALINE INFUSION - SIMPLE MED
2.5000 [IU]/h | INTRAVENOUS | Status: DC
  Administered 2018-07-03: 2.5 [IU]/h via INTRAVENOUS

## 2018-07-03 MED ORDER — LACTATED RINGERS IV SOLN
500.0000 mL | Freq: Once | INTRAVENOUS | Status: AC
  Administered 2018-07-03: 10:00:00 500 mL via INTRAVENOUS

## 2018-07-03 MED ORDER — IBUPROFEN 800 MG PO TABS
800.0000 mg | ORAL_TABLET | Freq: Three times a day (TID) | ORAL | Status: DC
  Administered 2018-07-03 – 2018-07-05 (×6): 800 mg via ORAL
  Filled 2018-07-03 (×6): qty 1

## 2018-07-03 MED ORDER — WITCH HAZEL-GLYCERIN EX PADS: 1.0000 "application " | MEDICATED_PAD | CUTANEOUS | Status: DC | PRN

## 2018-07-03 MED ORDER — PNEUMOCOCCAL VAC POLYVALENT 25 MCG/0.5ML IJ INJ: 0.5000 mL | INJECTION | INTRAMUSCULAR | Status: DC

## 2018-07-03 MED ORDER — TERBUTALINE SULFATE 1 MG/ML IJ SOLN: 0.2500 mg | Freq: Once | INTRAMUSCULAR | Status: DC | PRN

## 2018-07-03 MED ORDER — FENTANYL-BUPIVACAINE-NACL 0.5-0.125-0.9 MG/250ML-% EP SOLN: 12.0000 mL/h | EPIDURAL | Status: DC | PRN

## 2018-07-03 MED ORDER — COCONUT OIL OIL: 1.0000 "application " | TOPICAL_OIL | Status: DC | PRN

## 2018-07-03 MED ORDER — OXYTOCIN 40 UNITS IN NORMAL SALINE INFUSION - SIMPLE MED
1.0000 m[IU]/min | INTRAVENOUS | Status: DC
  Administered 2018-07-03: 09:00:00 4 m[IU]/min via INTRAVENOUS
  Filled 2018-07-03: qty 1000

## 2018-07-03 MED ORDER — TETANUS-DIPHTH-ACELL PERTUSSIS 5-2.5-18.5 LF-MCG/0.5 IM SUSP: 0.5000 mL | Freq: Once | INTRAMUSCULAR | Status: DC

## 2018-07-03 MED ORDER — ONDANSETRON HCL 4 MG PO TABS: 4.0000 mg | ORAL_TABLET | ORAL | Status: DC | PRN

## 2018-07-03 NOTE — Anesthesia Procedure Notes (Signed)
Epidural Patient location during procedure: OB Start time: 07/03/2018 10:21 AM End time: 07/03/2018 10:25 AM  Staffing Anesthesiologist: Bethena Midget, MD  Preanesthetic Checklist Completed: patient identified, site marked, surgical consent, pre-op evaluation, timeout performed, IV checked, risks and benefits discussed and monitors and equipment checked  Epidural Patient position: sitting Prep: site prepped and draped and DuraPrep Patient monitoring: continuous pulse ox and blood pressure Approach: midline Location: L3-L4 Injection technique: LOR air  Needle:  Needle type: Tuohy  Needle gauge: 17 G Needle length: 9 cm and 9 Needle insertion depth: 5 cm cm Catheter type: closed end flexible Catheter size: 19 Gauge Catheter at skin depth: 10 cm Test dose: negative  Assessment Events: blood not aspirated, injection not painful, no injection resistance, negative IV test and no paresthesia

## 2018-07-03 NOTE — H&P (Signed)
23 y.o. [redacted]w[redacted]d  [id number] comes in for induction of labor.  Otherwise has good fetal movement and no bleeding.  Past Medical History:  Diagnosis Date  . Medical history non-contributory     Past Surgical History:  Procedure Laterality Date  . DILATION AND EVACUATION N/A 04/27/2017   Procedure: DILATATION AND EVACUATION;  Surgeon: Reva Bores, MD;  Location: [facility]S;  Service: Gynecology;  Laterality: N/A;    OB History  Gravida Para Term Preterm AB Living  4 3 3  0 1 3  SAB TAB Ectopic Multiple Live Births  0 1 0 0 3    # Outcome Date GA Lbr Len/2nd Weight Sex Delivery Anes PTL Lv  4 Term 07/03/18 [redacted]w[redacted]d 01:29 / 00:09 3220 g M Vag-Spont EPI  LIV     Birth Comments: facial bruising  3 TAB           2 Term           1 Term             Social History   Socioeconomic History  . Marital status: Single    Spouse name: Not on file  . Number of children: Not on file  . Years of education: Not on file  . Highest education level: Not on file  Occupational History  . Not on file  Social Needs  . Financial resource strain: Not on file  . Food insecurity:    Worry: Not on file    Inability: Not on file  . Transportation needs:    Medical: Not on file    Non-medical: Not on file  Tobacco Use  . Smoking status: Current Every Day Smoker    Packs/day: 0.50  . Smokeless tobacco: Never Used  Substance and Sexual Activity  . Alcohol use: Yes    Comment: socially  . Drug use: No  . Sexual activity: Not Currently  Lifestyle  . Physical activity:    Days per week: Not on file    Minutes per session: Not on file  . Stress: Not on file  Relationships  . Social connections:    Talks on phone: Not on file    Gets together: Not on file    Attends religious service: Not on file    Active member of club or organization: Not on file    Attends meetings of clubs or organizations: Not on file    Relationship status: Not on file  . Intimate partner violence:    Fear of current or ex  partner: Not on file    Emotionally abused: Not on file    Physically abused: Not on file    Forced sexual activity: Not on file  Other Topics Concern  . Not on file  Social History Narrative  . Not on file   Patient has no known allergies.    Prenatal Transfer Tool  Maternal Diabetes: No Genetic Screening: Normal Maternal Ultrasounds/Referrals: Normal Fetal Ultrasounds or other Referrals:  None Maternal Substance Abuse:  Yes:  Type: Smoker Significant Maternal Medications:  None Significant Maternal Lab Results: None  Other PNC: uncomplicated except for anemia of pregnancy and iron infusion    Vitals:   07/03/18 1026 07/03/18 1031 07/03/18 1100 07/03/18 1104  BP: 114/75 112/78 105/80 118/78  Pulse: 81 80 91   Resp:  20 18   Temp:      TempSrc:      SpO2: 100% 100%    Weight:      Height:  Results for orders placed or performed during the hospital encounter of 07/03/18 (from the past 24 hour(s))  OB RESULT CONSOLE Group B Strep     Status: None   Collection Time: 07/03/18 12:00 AM  Result Value Ref Range   GBS Negative   OB RESULTS CONSOLE GC/Chlamydia     Status: None   Collection Time: 07/03/18 12:00 AM  Result Value Ref Range   Gonorrhea Negative    Chlamydia Negative   OB RESULTS CONSOLE RPR     Status: None   Collection Time: 07/03/18 12:00 AM  Result Value Ref Range   RPR Nonreactive   OB RESULTS CONSOLE HIV antibody     Status: None   Collection Time: 07/03/18 12:00 AM  Result Value Ref Range   HIV Non-reactive   OB RESULTS CONSOLE Rubella Antibody     Status: None   Collection Time: 07/03/18 12:00 AM  Result Value Ref Range   Rubella Nonimmune   OB RESULTS CONSOLE Hepatitis B surface antigen     Status: None   Collection Time: 07/03/18 12:00 AM  Result Value Ref Range   Hepatitis B Surface Ag Negative   OB RESULTS CONSOLE ABO/Rh     Status: None   Collection Time: 07/03/18 12:00 AM  Result Value Ref Range   RH Type  Positive    ABO  Grouping AB   OB RESULTS CONSOLE Antibody Screen     Status: None   Collection Time: 07/03/18 12:00 AM  Result Value Ref Range   Antibody Screen Negative   Type and screen     Status: None   Collection Time: 07/03/18  8:29 AM  Result Value Ref Range   ABO/RH(D) AB POS    Antibody Screen NEG    Sample Expiration      07/06/2018,2359 Performed at [facility], [address] [place], [place] [address]   CBC     Status: Abnormal   Collection Time: 07/03/18  9:00 AM  Result Value Ref Range   WBC 11.8 (H) 4.0 - 10.5 K/uL   RBC 4.03 3.87 - 5.11 MIL/uL   Hemoglobin 11.4 (L) 12.0 - 15.0 g/dL   HCT 95.234.2 (L) 84.136.0 - 32.446.0 %   MCV 84.9 80.0 - 100.0 fL   MCH 28.3 26.0 - 34.0 pg   MCHC 33.3 30.0 - 36.0 g/dL   RDW Not Measured 40.111.5 - 15.5 %   Platelets 222 150 - 400 K/uL   nRBC 0.0 0.0 - 0.2 %  SARS Coronavirus 2 (CEPHEID - Performed in [facility]), Hosp Order     Status: None   Collection Time: 07/03/18  9:01 AM  Result Value Ref Range   SARS Coronavirus 2 NEGATIVE NEGATIVE    Lungs/Cor:  NAD Abdomen:  soft, gravid Ex:  no cords, erythema SVE:  3/70/-2 FHTs:  NST R; Cat 1 tracing. Toco:  q irreg   A/P   Term induction.   GBS neg.  Crystal [name]

## 2018-07-04 LAB — CBC
HCT: 27.7 % — ABNORMAL LOW (ref 36.0–46.0)
Hemoglobin: 9.5 g/dL — ABNORMAL LOW (ref 12.0–15.0)
MCH: 28.5 pg (ref 26.0–34.0)
MCHC: 34.3 g/dL (ref 30.0–36.0)
MCV: 83.2 fL (ref 80.0–100.0)
Platelets: 197 10*3/uL (ref 150–400)
RBC: 3.33 MIL/uL — ABNORMAL LOW (ref 3.87–5.11)
WBC: 15.6 10*3/uL — ABNORMAL HIGH (ref 4.0–10.5)
nRBC: 0 % (ref 0.0–0.2)

## 2018-07-04 LAB — RPR: RPR Ser Ql: NONREACTIVE

## 2018-07-04 NOTE — Anesthesia Preprocedure Evaluation (Signed)
Anesthesia Evaluation  Patient identified by MRN, date of birth, ID band Patient awake    Reviewed: Allergy & Precautions, H&P , NPO status , Patient's Chart, lab work & pertinent test results, reviewed documented beta blocker date and time   Airway Mallampati: II  TM Distance: >3 FB Neck ROM: full    Dental no notable dental hx.    Pulmonary neg pulmonary ROS, Current Smoker,    Pulmonary exam normal breath sounds clear to auscultation       Cardiovascular negative cardio ROS Normal cardiovascular exam Rhythm:regular Rate:Normal     Neuro/Psych negative neurological ROS  negative psych ROS   GI/Hepatic negative GI ROS, Neg liver ROS,   Endo/Other  negative endocrine ROS  Renal/GU negative Renal ROS  negative genitourinary   Musculoskeletal   Abdominal   Peds  Hematology negative hematology ROS (+)   Anesthesia Other Findings   Reproductive/Obstetrics (+) Pregnancy                             Anesthesia Physical Anesthesia Plan  ASA: II  Anesthesia Plan: Epidural   Post-op Pain Management:    Induction:   PONV Risk Score and Plan:   Airway Management Planned:   Additional Equipment:   Intra-op Plan:   Post-operative Plan:   Informed Consent: I have reviewed the patients History and Physical, chart, labs and discussed the procedure including the risks, benefits and alternatives for the proposed anesthesia with the patient or authorized representative who has indicated his/her understanding and acceptance.     Dental Advisory Given  Plan Discussed with:   Anesthesia Plan Comments: (Labs checked- platelets confirmed with RN in room. Fetal heart tracing, per RN, reported to be stable enough for sitting procedure. Discussed epidural, and patient consents to the procedure:  included risk of possible headache,backache, failed block, allergic reaction, and nerve injury. This  patient was asked if she had any questions or concerns before the procedure started.)        Anesthesia Quick Evaluation

## 2018-07-04 NOTE — Anesthesia Postprocedure Evaluation (Signed)
Anesthesia Post Note  Patient: Oleva Kipp  Procedure(s) Performed: AN AD HOC LABOR EPIDURAL     Patient location during evaluation: Mother Baby Anesthesia Type: Epidural Level of consciousness: awake and alert, oriented and patient cooperative Pain management: pain level controlled Vital Signs Assessment: post-procedure vital signs reviewed and stable Respiratory status: spontaneous breathing Cardiovascular status: stable Postop Assessment: no headache, epidural receding, patient able to bend at knees and no signs of nausea or vomiting Anesthetic complications: no Comments: Pt.  Interviewed via phone call d/t COVID 19 precautions.  Pt. States she is walking.  Pt. Score is 3.      Last Vitals:  Vitals:   07/03/18 2125 07/04/18 0340  BP: 105/69 90/60  Pulse: 75 71  Resp: 20 20  Temp: 36.7 C 36.7 C  SpO2: 99% 100%    Last Pain:  Vitals:   07/04/18 0600  TempSrc:   PainSc: 7    Pain Goal:                   [facility]

## 2018-07-04 NOTE — Addendum Note (Signed)
Addendum  created 07/04/18 0742 by Angela Adam, CRNA   Charge Capture section accepted, Clinical Note Signed

## 2018-07-04 NOTE — Progress Notes (Signed)
Patient is eating, ambulating, voiding.  Pain control is good.  Vitals:   07/03/18 1345 07/03/18 1650 07/03/18 2125 07/04/18 0340  BP: 126/70 114/80 105/69 90/60  Pulse: 66 78 75 71  Resp: _0 Temp: 97.6 F (36.4 C) 98.6 F (37 C) 98.1 F (36.7 C) 98 F (36.7 C)  TempSrc: Oral Oral Oral Oral  SpO2:  99% 99% 100%  Weight:      Height:        Fundus firm Perineum without swelling.  Lab Results  Component Value Date   WBC 15.6 (H) 07/04/2018   HGB 9.5 (L) 07/04/2018   HCT 27.7 (L) 07/04/2018   MCV 83.2 07/04/2018   PLT 197 07/04/2018    --/--/AB POS (05/15 0829)/R NONimmune  A/P Post partum day 1.  MMR today. Routine care.  Expect d/c late today or tomorrow.   Parents desires circumsision.  All risks, benefits and alternatives discussed with the mother.  Daria Pastures

## 2018-07-04 NOTE — Anesthesia Postprocedure Evaluation (Signed)
Anesthesia Post Note  Patient: Crystal [name]  Procedure(s) Performed: AN AD HOC LABOR EPIDURAL     Patient location during evaluation: Mother Baby Anesthesia Type: Epidural Level of consciousness: awake and alert Pain management: pain level controlled Vital Signs Assessment: post-procedure vital signs reviewed and stable Respiratory status: spontaneous breathing, nonlabored ventilation and respiratory function stable Cardiovascular status: stable Postop Assessment: no headache, no backache and epidural receding Anesthetic complications: no    Last Vitals:  Vitals:   07/03/18 2125 07/04/18 0340  BP: 105/69 90/60  Pulse: 75 71  Resp: 20 20  Temp: 36.7 C 36.7 C  SpO2: 99% 100%    Last Pain:  Vitals:   07/04/18 0600  TempSrc:   PainSc: 7    Pain Goal:                   [name]

## 2018-07-04 NOTE — Clinical Social Work Maternal (Signed)
CLINICAL SOCIAL WORK MATERNAL/CHILD NOTE  Patient Details  Name: Crystal [name] MRN: [id number] Date of Birth: Mar 09, 1995  Date:  07/04/2018  Clinical Social Worker Initiating Note:  Laurey Arrow Date/Time: Initiated:  07/04/18/      Child's Name:  Crystal [name]   Biological Parents:  Mother, Father(FOB is Myriam Forehand 05/10/1991. He resides at [place]. [place] [address].)   Need for Interpreter:  None   Reason for Referral:  Current Substance Use/Substance Use During Pregnancy (hx of marijuana use. )   Address:  [place] [address]    Phone number:  [phone] (home)     Additional phone number:   Household Members/Support Persons (HM/SP):   Household Member/Support Person 1, Household Member/Support Person 2   HM/SP Name Relationship DOB or Age  HM/SP -1 Annlee Glandon daughter 07/09/14  HM/SP -2 Midori Dado Daughter 08/15/2015  HM/SP -3        HM/SP -4        HM/SP -5        HM/SP -6        HM/SP -7        HM/SP -8          Natural Supports (not living in the home):  Extended Family, Immediate Family, Parent(Per MOB, FOB's Family will also provide supports. )   Professional Supports: None   Employment: Unemployed   Type of Work:     Education:  Programmer, systems   Homebound arranged:    Museum/gallery curator Resources:  Medicaid   Other Resources:  Theatre stage manager Considerations Which May Impact Care:  None Reported  Strengths:  Ability to meet basic needs , Home prepared for child , Pediatrician chosen   Psychotropic Medications:         Pediatrician:    [place] area  Pediatrician List:   [facility]      Pediatrician Fax Number:    Risk Factors/Current Problems:  Substance Use    Cognitive State:  Alert , Linear Thinking    Mood/Affect:  Apprehensive ,  Agitated , Comfortable , Irritable    CSW Assessment: CSW met with MOB in room 520 to complete an assessment for substance abuse hx. When CSW arrived MOB was resting in bed engaging in skin to skin with infant. MOB and infant appeared bonded and comfortable. MOB appeared irritated and hesitant to speak with CSW.  However, she delivered appropriate eye contact and responded to CSW questions .  CSW asked about MOB's SA hx and MOB openly shared her use of marijuana throughout her pregnancy.  MOB stated, "I smoked so I wouldn't throw my brains up." MOB reported that she smoked to decrease her nausea (last use 1 week ago). Per MOB she was prescribed nausea medication throughout her pregnancy but it did not treat her symptoms. MOB denied the use of all other illicit substances. CSW explained the hospital's drug exposure policy and MOB's irritation appeared to increase.  CSW made MOB aware that CSW will make a report to [facility] CPS due to infant's positive UDS for THC.  MOB communicated, "Well there is no need for you to call CPS to my house because I am terminating my rights to this child and financial responsibilities." MOB's behaviors towards the child was not congruent with MOB's desire to terminate  her rights.  MOB appeared bonded and appropriately responded to infant's cues with love and affection. CSW attempted to process the need for MOB to terminate her rights and MOB responded, "Because I don't want CPS involved." CSW  explained CPS investigation process and MOB stated, "I know how it goes." MOB acknowledged CPS hx in [place] and stated, "It took them forever to close my case for some damn THC."   MOB reports having a good support team that primarily consists of FOB, FOB's family, and MOB's grandmother.   MOB communicated she has all essential items for infant including a car seat and a bassinet.   CSW thanked MOB for meeting with CSW.  CPS made a [place] report with worker, Ernesta Amble.  CPS will follow-up with family within 72 hours.  There are no barriers to infant's discharge.   CSW Plan/Description:  No Further Intervention Required/No Barriers to Discharge, Sudden Infant Death Syndrome (SIDS) Education, Perinatal Mood and Anxiety Disorder (PMADs) Education, Other Information/Referral to [employer], Child Copy Report , CSW Will Continue to Monitor Umbilical Cord Tissue Drug Screen Results and Make Report if [place], Other Patient/Family Education  Laurey Arrow, MSW, LCSW Clinical Social Work [phone]  Dimple Nanas, [place] 07/04/2018, 1:23 PM

## 2018-07-05 MED ORDER — FERROUS SULFATE 325 (65 FE) MG PO TABS: 325.0000 mg | ORAL_TABLET | Freq: Two times a day (BID) | ORAL | 3 refills | Status: DC

## 2018-07-05 NOTE — Progress Notes (Signed)
Patient is eating, ambulating, voiding.  Pain control is good.  Vitals:   07/04/18 0340 07/04/18 1355 07/04/18 2313 07/05/18 0545  BP: 90/60 109/77 106/68 113/76  Pulse: 71 73 75 81  Resp: '20 18 18 16  '$ Temp: 98 F (36.7 C) 98.9 F (37.2 C) 98.4 F (36.9 C) 98.1 F (36.7 C)  TempSrc: Oral Oral  Oral  SpO2: 100%     Weight:      Height:        Fundus firm Perineum without swelling.  Lab Results  Component Value Date   WBC 15.6 (H) 07/04/2018   HGB 9.5 (L) 07/04/2018   HCT 27.7 (L) 07/04/2018   MCV 83.2 07/04/2018   PLT 197 07/04/2018    --/--/AB POS (05/15 0829)/R NON-I  A/P Post partum day 2.  Routine care.  Expect d/c today.  MMR. Iron.  Daria Pastures

## 2018-07-05 NOTE — Progress Notes (Signed)
CSW escorted CPS worker Thomasenia Bottoms to MOB's room to initiatie a safety plan for family.  CPS reported to CSW that there are no barriers to infant's discharge and CPS will continue to offer the family resources and supports after discharge.    Blaine Hamper, MSW, LCSW Clinical Social Work [phone]

## 2018-07-09 NOTE — Discharge Summary (Signed)
Obstetric Discharge Summary Reason for Admission: induction of labor Prenatal Procedures: none Intrapartum Procedures: spontaneous vaginal delivery Postpartum Procedures: Rubella Ig Complications-Operative and Postpartum: none Hemoglobin  Date Value Ref Range Status  07/04/2018 9.5 (L) 12.0 - 15.0 g/dL Final   HCT  Date Value Ref Range Status  07/04/2018 27.7 (L) 36.0 - 46.0 % Final     Discharge Diagnoses: Term Pregnancy-delivered  Discharge Information: Date: 07/09/2018 Activity: pelvic rest Diet: routine Medications: Ibuprofen and Iron Condition: stable Instructions: refer to practice specific booklet Discharge to: home Follow-up Information    Carrington Clamp, MD Follow up in 4 week(s).   Specialty:  Obstetrics and Gynecology Contact information: [address] [place] [address] [phone]           Newborn Data: Live born female  Birth Weight: 7 lb 1.6 oz (3220 g) APGAR: 9, 9  Newborn Delivery   Birth date/time:  [birth year]/2018 10:53:00 Delivery type:  Vaginal, Spontaneous     Home with mother.  Loney Laurence 07/09/2018, 7:29 PM

## 2018-08-24 ENCOUNTER — Encounter (HOSPITAL_BASED_OUTPATIENT_CLINIC_OR_DEPARTMENT_OTHER): Payer: Self-pay | Admitting: *Deleted

## 2018-08-24 ENCOUNTER — Other Ambulatory Visit (HOSPITAL_COMMUNITY)
Admission: RE | Admit: 2018-08-24 | Discharge: 2018-08-24 | Disposition: A | Discharge status: Home / Self Care | Payer: Medicaid Other | Source: Ambulatory Visit | Attending: Obstetrics and Gynecology | Admitting: Obstetrics and Gynecology

## 2018-08-24 ENCOUNTER — Other Ambulatory Visit: Payer: Self-pay

## 2018-08-24 ENCOUNTER — Other Ambulatory Visit: Payer: Self-pay | Admitting: Obstetrics and Gynecology

## 2018-08-24 DIAGNOSIS — Z1159 Encounter for screening for other viral diseases: Secondary | ICD-10-CM | POA: Diagnosis not present

## 2018-08-24 DIAGNOSIS — Z01812 Encounter for preprocedural laboratory examination: Secondary | ICD-10-CM | POA: Diagnosis not present

## 2018-08-24 LAB — SARS CORONAVIRUS 2 (TAT 6-24 HRS): SARS Coronavirus 2: NEGATIVE

## 2018-08-24 NOTE — H&P (Signed)
23 y.o. [id number] desires permanent sterilization  Past Medical History:  Diagnosis Date  . Medical history non-contributory    Past Surgical History:  Procedure Laterality Date  . DILATION AND EVACUATION N/A 04/27/2017   Procedure: DILATATION AND EVACUATION;  Surgeon: Donnamae Jude, MD;  Location: [facility];  Service: Gynecology;  Laterality: N/A;    Social History   Socioeconomic History  . Marital status: Single    Spouse name: Not on file  . Number of children: Not on file  . Years of education: Not on file  . Highest education level: Not on file  Occupational History  . Not on file  Social Needs  . Financial resource strain: Not on file  . Food insecurity    Worry: Not on file    Inability: Not on file  . Transportation needs    Medical: Not on file    Non-medical: Not on file  Tobacco Use  . Smoking status: Current Every Day Smoker    Packs/day: 0.50  . Smokeless tobacco: Never Used  Substance and Sexual Activity  . Alcohol use: Yes    Comment: socially  . Drug use: No  . Sexual activity: Not Currently  Lifestyle  . Physical activity    Days per week: Not on file    Minutes per session: Not on file  . Stress: Not on file  Relationships  . Social Herbalist on phone: Not on file    Gets together: Not on file    Attends religious service: Not on file    Active member of club or organization: Not on file    Attends meetings of clubs or organizations: Not on file    Relationship status: Not on file  . Intimate partner violence    Fear of current or ex partner: Not on file    Emotionally abused: Not on file    Physically abused: Not on file    Forced sexual activity: Not on file  Other Topics Concern  . Not on file  Social History Narrative  . Not on file    No current facility-administered medications on file prior to encounter.    Current Outpatient Medications on File Prior to Encounter  Medication Sig Dispense Refill  . ferrous sulfate 325  (65 FE) MG tablet Take 1 tablet (325 mg total) by mouth 2 (two) times daily with a meal. 100 tablet 3  . ondansetron (ZOFRAN) 8 MG tablet Take 8 mg by mouth every 8 (eight) hours as needed for nausea or vomiting.      No Known Allergies  There were no vitals filed for this visit.  Lungs: clear to ascultation Cor:  RRR Abdomen:  soft, nontender, nondistended. Ex:  no cords, erythema Pelvic:  Nefg, normal uterus, no masses  A:  For laparoscopic BTL   P: P: All risks, benefits and alternatives d/w patient and she desires to proceed.  Patient has undergone ERAS protocol and will receive SCDs during the operation.    Daria Pastures

## 2018-08-24 NOTE — Progress Notes (Signed)
Spoke with Crystal [name] after midnight. 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

## 2018-08-26 ENCOUNTER — Ambulatory Visit (HOSPITAL_BASED_OUTPATIENT_CLINIC_OR_DEPARTMENT_OTHER)
Admission: RE | Admit: 2018-08-26 | Discharge: 2018-08-26 | Disposition: A | Discharge status: Home / Self Care | Payer: Medicaid Other | Attending: Obstetrics and Gynecology | Admitting: Obstetrics and Gynecology

## 2018-08-26 ENCOUNTER — Ambulatory Visit (HOSPITAL_BASED_OUTPATIENT_CLINIC_OR_DEPARTMENT_OTHER): Payer: Medicaid Other | Admitting: Anesthesiology

## 2018-08-26 ENCOUNTER — Encounter (HOSPITAL_BASED_OUTPATIENT_CLINIC_OR_DEPARTMENT_OTHER): Payer: Self-pay | Admitting: *Deleted

## 2018-08-26 ENCOUNTER — Other Ambulatory Visit: Payer: Self-pay

## 2018-08-26 ENCOUNTER — Encounter (HOSPITAL_BASED_OUTPATIENT_CLINIC_OR_DEPARTMENT_OTHER)
Admission: RE | Disposition: A | Discharge status: Home / Self Care | Payer: Self-pay | Source: Home / Self Care | Attending: Obstetrics and Gynecology

## 2018-08-26 DIAGNOSIS — Z302 Encounter for sterilization: Secondary | ICD-10-CM | POA: Insufficient documentation

## 2018-08-26 DIAGNOSIS — F17200 Nicotine dependence, unspecified, uncomplicated: Secondary | ICD-10-CM | POA: Insufficient documentation

## 2018-08-26 HISTORY — PX: LAPAROSCOPIC TUBAL LIGATION: SHX1937

## 2018-08-26 LAB — CBC
HCT: 41.3 % (ref 36.0–46.0)
Hemoglobin: 13.9 g/dL (ref 12.0–15.0)
MCH: 31.4 pg (ref 26.0–34.0)
MCHC: 33.7 g/dL (ref 30.0–36.0)
MCV: 93.2 fL (ref 80.0–100.0)
Platelets: 238 10*3/uL (ref 150–400)
RBC: 4.43 MIL/uL (ref 3.87–5.11)
RDW: 14.6 % (ref 11.5–15.5)
WBC: 6.8 10*3/uL (ref 4.0–10.5)
nRBC: 0 % (ref 0.0–0.2)

## 2018-08-26 LAB — POCT PREGNANCY, URINE: Preg Test, Ur: NEGATIVE

## 2018-08-26 SURGERY — LIGATION, FALLOPIAN TUBE, LAPAROSCOPIC
Anesthesia: General | Site: Abdomen | Laterality: Bilateral

## 2018-08-26 MED ORDER — FENTANYL CITRATE (PF) 100 MCG/2ML IJ SOLN
INTRAMUSCULAR | Status: AC
  Filled 2018-08-26: qty 2

## 2018-08-26 MED ORDER — SODIUM CHLORIDE (PF) 0.9 % IJ SOLN
INTRAMUSCULAR | Status: DC | PRN
  Administered 2018-08-26: 50 mL

## 2018-08-26 MED ORDER — SOD CITRATE-CITRIC ACID 500-334 MG/5ML PO SOLN
30.0000 mL | ORAL | Status: AC
  Administered 2018-08-26: 30 mL via ORAL
  Filled 2018-08-26: qty 30

## 2018-08-26 MED ORDER — FENTANYL CITRATE (PF) 100 MCG/2ML IJ SOLN
25.0000 ug | INTRAMUSCULAR | Status: DC | PRN
  Administered 2018-08-26 (×2): 50 ug via INTRAVENOUS
  Filled 2018-08-26: qty 1

## 2018-08-26 MED ORDER — DEXAMETHASONE SODIUM PHOSPHATE 10 MG/ML IJ SOLN
INTRAMUSCULAR | Status: AC
  Filled 2018-08-26: qty 1

## 2018-08-26 MED ORDER — ONDANSETRON HCL 4 MG/2ML IJ SOLN
INTRAMUSCULAR | Status: AC
  Filled 2018-08-26: qty 2

## 2018-08-26 MED ORDER — OXYCODONE-ACETAMINOPHEN 5-325 MG PO TABS: 1.0000 | ORAL_TABLET | ORAL | 0 refills | Status: AC | PRN

## 2018-08-26 MED ORDER — SODIUM CHLORIDE 0.9 % IR SOLN
Status: DC | PRN
  Administered 2018-08-26: 1

## 2018-08-26 MED ORDER — OXYCODONE HCL 5 MG/5ML PO SOLN
5.0000 mg | Freq: Once | ORAL | Status: AC | PRN
  Filled 2018-08-26: qty 5

## 2018-08-26 MED ORDER — ROPIVACAINE HCL 0.5 % EP SOLN
EPIDURAL | Status: DC | PRN
  Administered 2018-08-26: 30 mL via SURGICAL_CAVITY

## 2018-08-26 MED ORDER — SCOPOLAMINE 1 MG/3DAYS TD PT72
1.0000 | MEDICATED_PATCH | Freq: Once | TRANSDERMAL | Status: DC
  Administered 2018-08-26: 1.5 mg via TRANSDERMAL
  Filled 2018-08-26: qty 1

## 2018-08-26 MED ORDER — LIDOCAINE 2% (20 MG/ML) 5 ML SYRINGE
INTRAMUSCULAR | Status: AC
  Filled 2018-08-26: qty 5

## 2018-08-26 MED ORDER — PROPOFOL 10 MG/ML IV BOLUS
INTRAVENOUS | Status: DC | PRN
  Administered 2018-08-26: 100 mg via INTRAVENOUS

## 2018-08-26 MED ORDER — LACTATED RINGERS IV SOLN
INTRAVENOUS | Status: DC
  Administered 2018-08-26: 13:00:00 via INTRAVENOUS
  Filled 2018-08-26: qty 1000

## 2018-08-26 MED ORDER — LIDOCAINE 2% (20 MG/ML) 5 ML SYRINGE
INTRAMUSCULAR | Status: DC | PRN
  Administered 2018-08-26: 100 mg via INTRAVENOUS

## 2018-08-26 MED ORDER — KETOROLAC TROMETHAMINE 30 MG/ML IJ SOLN
INTRAMUSCULAR | Status: AC
  Filled 2018-08-26: qty 1

## 2018-08-26 MED ORDER — ROCURONIUM BROMIDE 100 MG/10ML IV SOLN
INTRAVENOUS | Status: DC | PRN
  Administered 2018-08-26: 45 mg via INTRAVENOUS

## 2018-08-26 MED ORDER — FENTANYL CITRATE (PF) 100 MCG/2ML IJ SOLN
INTRAMUSCULAR | Status: DC | PRN
  Administered 2018-08-26 (×3): 50 ug via INTRAVENOUS

## 2018-08-26 MED ORDER — SOD CITRATE-CITRIC ACID 500-334 MG/5ML PO SOLN
ORAL | Status: AC
  Filled 2018-08-26: qty 15

## 2018-08-26 MED ORDER — MIDAZOLAM HCL 5 MG/5ML IJ SOLN
INTRAMUSCULAR | Status: DC | PRN
  Administered 2018-08-26: 2 mg via INTRAVENOUS

## 2018-08-26 MED ORDER — OXYCODONE HCL 5 MG PO TABS
5.0000 mg | ORAL_TABLET | Freq: Once | ORAL | Status: AC | PRN
  Administered 2018-08-26: 5 mg via ORAL
  Filled 2018-08-26: qty 1

## 2018-08-26 MED ORDER — PROPOFOL 10 MG/ML IV BOLUS
INTRAVENOUS | Status: AC
  Filled 2018-08-26: qty 20

## 2018-08-26 MED ORDER — PROMETHAZINE HCL 25 MG/ML IJ SOLN
6.2500 mg | INTRAMUSCULAR | Status: DC | PRN
  Filled 2018-08-26: qty 1

## 2018-08-26 MED ORDER — ROCURONIUM BROMIDE 10 MG/ML (PF) SYRINGE
PREFILLED_SYRINGE | INTRAVENOUS | Status: AC
  Filled 2018-08-26: qty 10

## 2018-08-26 MED ORDER — FENTANYL CITRATE (PF) 250 MCG/5ML IJ SOLN
INTRAMUSCULAR | Status: AC
  Filled 2018-08-26: qty 5

## 2018-08-26 MED ORDER — SCOPOLAMINE 1 MG/3DAYS TD PT72
MEDICATED_PATCH | TRANSDERMAL | Status: AC
  Filled 2018-08-26: qty 1

## 2018-08-26 MED ORDER — ACETAMINOPHEN 10 MG/ML IV SOLN
1000.0000 mg | Freq: Once | INTRAVENOUS | Status: DC | PRN
  Filled 2018-08-26: qty 100

## 2018-08-26 MED ORDER — ACETAMINOPHEN 500 MG PO TABS
ORAL_TABLET | ORAL | Status: AC
  Filled 2018-08-26: qty 2

## 2018-08-26 MED ORDER — SUGAMMADEX SODIUM 200 MG/2ML IV SOLN
INTRAVENOUS | Status: DC | PRN
  Administered 2018-08-26: 200 mg via INTRAVENOUS

## 2018-08-26 MED ORDER — KETOROLAC TROMETHAMINE 30 MG/ML IJ SOLN
30.0000 mg | Freq: Once | INTRAMUSCULAR | Status: AC | PRN
  Administered 2018-08-26: 30 mg via INTRAVENOUS
  Filled 2018-08-26: qty 1

## 2018-08-26 MED ORDER — ACETAMINOPHEN 500 MG PO TABS
1000.0000 mg | ORAL_TABLET | Freq: Once | ORAL | Status: AC
  Administered 2018-08-26: 1000 mg via ORAL
  Filled 2018-08-26: qty 2

## 2018-08-26 MED ORDER — MIDAZOLAM HCL 2 MG/2ML IJ SOLN
INTRAMUSCULAR | Status: AC
  Filled 2018-08-26: qty 2

## 2018-08-26 MED ORDER — ONDANSETRON HCL 4 MG/2ML IJ SOLN
INTRAMUSCULAR | Status: DC | PRN
  Administered 2018-08-26: 4 mg via INTRAVENOUS

## 2018-08-26 MED ORDER — OXYCODONE HCL 5 MG PO TABS
ORAL_TABLET | ORAL | Status: AC
  Filled 2018-08-26: qty 1

## 2018-08-26 MED ORDER — DEXAMETHASONE SODIUM PHOSPHATE 10 MG/ML IJ SOLN
INTRAMUSCULAR | Status: DC | PRN
  Administered 2018-08-26: 5 mg via INTRAVENOUS

## 2018-08-26 MED ORDER — LACTATED RINGERS IV SOLN
INTRAVENOUS | Status: DC
  Administered 2018-08-26: 10:00:00 via INTRAVENOUS
  Filled 2018-08-26: qty 1000

## 2018-08-26 SURGICAL SUPPLY — 42 items
CABLE HIGH FREQUENCY MONO STRZ (ELECTRODE) IMPLANT
CANISTER SUCT 3000ML PPV (MISCELLANEOUS) ×3 IMPLANT
CATH ROBINSON RED A/P 16FR (CATHETERS) ×3 IMPLANT
CLIP FILSHIE TUBAL LIGA STRL (Clip) ×2 IMPLANT
COVER MAYO STAND STRL (DRAPES) ×3 IMPLANT
COVER WAND RF STERILE (DRAPES) ×3 IMPLANT
DERMABOND ADVANCED (GAUZE/BANDAGES/DRESSINGS) ×2
DERMABOND ADVANCED .7 DNX12 (GAUZE/BANDAGES/DRESSINGS) ×1 IMPLANT
DRSG COVADERM PLUS 2X2 (GAUZE/BANDAGES/DRESSINGS) ×3 IMPLANT
DURAPREP 26ML APPLICATOR (WOUND CARE) ×3 IMPLANT
GAUZE 4X4 16PLY RFD (DISPOSABLE) ×3 IMPLANT
GLOVE BIO SURGEON STRL SZ7 (GLOVE) ×3 IMPLANT
GOWN STRL REUS W/TWL LRG LVL3 (GOWN DISPOSABLE) ×3 IMPLANT
KIT TURNOVER CYSTO (KITS) ×3 IMPLANT
LIGASURE VESSEL 5MM BLUNT TIP (ELECTROSURGICAL) IMPLANT
NDL INSUFFLATION 14GA 150MM (NEEDLE) IMPLANT
NEEDLE HYPO 22GX1.5 SAFETY (NEEDLE) IMPLANT
NEEDLE INSUFFLATION 120MM (ENDOMECHANICALS) ×3 IMPLANT
NEEDLE INSUFFLATION 14GA 150MM (NEEDLE) IMPLANT
NS IRRIG 500ML POUR BTL (IV SOLUTION) ×3 IMPLANT
PACK LAPAROSCOPY BASIN (CUSTOM PROCEDURE TRAY) ×3 IMPLANT
PACK TRENDGUARD 450 HYBRID PRO (MISCELLANEOUS) ×1 IMPLANT
PAD OB MATERNITY 4.3X12.25 (PERSONAL CARE ITEMS) ×3 IMPLANT
POUCH SPECIMEN RETRIEVAL 10MM (ENDOMECHANICALS) IMPLANT
SCISSORS LAP 5X35 DISP (ENDOMECHANICALS) IMPLANT
SEALER TISSUE G2 CVD JAW 35 (ENDOMECHANICALS) IMPLANT
SEALER TISSUE G2 CVD JAW 45CM (ENDOMECHANICALS)
SET IRRIG TUBING LAPAROSCOPIC (IRRIGATION / IRRIGATOR) IMPLANT
SOLUTION ELECTROLUBE (MISCELLANEOUS) IMPLANT
SUT VICRYL 0 UR6 27IN ABS (SUTURE) ×3 IMPLANT
SUT VICRYL RAPIDE 3 0 (SUTURE) IMPLANT
SUT VICRYL RAPIDE 4/0 PS 2 (SUTURE) ×3 IMPLANT
SYR 50ML LL SCALE MARK (SYRINGE) ×3 IMPLANT
TOWEL OR 17X26 10 PK STRL BLUE (TOWEL DISPOSABLE) ×3 IMPLANT
TRAY FOLEY BAG SILVER LF 14FR (CATHETERS) ×3 IMPLANT
TRENDGUARD 450 HYBRID PRO PACK (MISCELLANEOUS) ×3
TROCAR DILATING TIP 12MM 150MM (ENDOMECHANICALS) IMPLANT
TROCAR OPTI TIP 5M 100M (ENDOMECHANICALS) ×6 IMPLANT
TROCAR XCEL DIL TIP R 11M (ENDOMECHANICALS) ×3 IMPLANT
TUBING EVAC SMOKE HEATED PNEUM (TUBING) ×3 IMPLANT
WARMER LAPAROSCOPE (MISCELLANEOUS) ×3 IMPLANT
WATER STERILE IRR 500ML POUR (IV SOLUTION) ×3 IMPLANT

## 2018-08-26 NOTE — Discharge Instructions (Signed)

## 2018-08-26 NOTE — Brief Op Note (Signed)
08/26/2018  12:33 PM  PATIENT:  Crystal [name]  23 y.o. female  PRE-OPERATIVE DIAGNOSIS:  DESIRES STERILIZATION  POST-OPERATIVE DIAGNOSIS:  * No post-op diagnosis entered *  PROCEDURE:  Procedure(s) with comments: [place] (Bilateral) - WITH FILSHIE CLIPS  SURGEON:  Surgeon(s) and Role:    * Bobbye Charleston, MD - Primary  ANESTHESIA:   general  EBL:  Min  LOCAL MEDICATIONS USED:  OTHER Ropivicaine in pelvis  SPECIMEN:  No Specimen  DISPOSITION OF SPECIMEN:  N/A  COUNTS:  YES  TOURNIQUET:  * No tourniquets in log *  DICTATION: .Note written in EPIC  PLAN OF CARE: Discharge to home after PACU  PATIENT DISPOSITION:  PACU - hemodynamically stable.   Delay start of Pharmacological VTE agent (>24hrs) due to surgical blood loss or risk of bleeding: not applicable

## 2018-08-26 NOTE — Progress Notes (Signed)
There has been no change in the patients history, status or exam since the history and physical.  Vitals:   08/24/18 1758 08/26/18 0948 08/26/18 1011  BP:  107/72   Pulse:  79   Resp:  14   Temp:  98 F (36.7 C)   TempSrc:  Oral   SpO2:  100%   Weight: 59 kg 58 kg   Height: 5\' 8"  (1.727 m)  5\' 8"  (1.727 m)    Results for orders placed or performed during the hospital encounter of 08/26/18 (from the past 72 hour(s))  Pregnancy, urine POC     Status: None   Collection Time: 08/26/18  9:56 AM  Result Value Ref Range   Preg Test, Ur NEGATIVE NEGATIVE    Comment:        THE SENSITIVITY OF THIS METHODOLOGY IS >24 mIU/mL   CBC     Status: None   Collection Time: 08/26/18 10:15 AM  Result Value Ref Range   WBC 6.8 4.0 - 10.5 K/uL   RBC 4.43 3.87 - 5.11 MIL/uL   Hemoglobin 13.9 12.0 - 15.0 g/dL   HCT 41.3 36.0 - 46.0 %   MCV 93.2 80.0 - 100.0 fL   MCH 31.4 26.0 - 34.0 pg   MCHC 33.7 30.0 - 36.0 g/dL   RDW 14.6 11.5 - 15.5 %   Platelets 238 150 - 400 K/uL   nRBC 0.0 0.0 - 0.2 %    Comment: Performed at [facility], [place] [address] [place], [place] [address]    Crystal [name]

## 2018-08-26 NOTE — Transfer of Care (Signed)
Immediate Anesthesia Transfer of Care Note  Patient: Crystal [name]  Procedure(s) Performed: LAPAROSCOPIC TUBAL LIGATION (Bilateral Abdomen)  Patient Location: PACU  Anesthesia Type:General  Level of Consciousness: awake, alert , oriented and patient cooperative  Airway & Oxygen Therapy: Patient Spontanous Breathing and Patient connected to nasal cannula oxygen  Post-op Assessment: Report given to RN and Post -op Vital signs reviewed and stable  Post vital signs: Reviewed and stable  Last Vitals:  Vitals Value Taken Time  BP 104/60 08/26/18 1249  Temp    Pulse 70 08/26/18 1252  Resp 15 08/26/18 1252  SpO2 100 % 08/26/18 1252  Vitals shown include unvalidated device data.  Last Pain:  Vitals:   08/26/18 1011  TempSrc:   PainSc: 0-No pain      Patients Stated Pain Goal: 8 (25/63/89 3734)  Complications: No apparent anesthesia complications

## 2018-08-26 NOTE — Anesthesia Procedure Notes (Signed)
Date/Time: 08/26/2018 12:11 PM Performed by: Garrel Ridgel, CRNA Pre-anesthesia Checklist: Patient identified, Suction available, Emergency Drugs available, Patient being monitored and Timeout performed Patient Re-evaluated:Patient Re-evaluated prior to induction Oxygen Delivery Method: Circle system utilized Preoxygenation: Pre-oxygenation with 100% oxygen Induction Type: IV induction Ventilation: Mask ventilation without difficulty Laryngoscope Size: Mac and 3 Grade View: Grade I Tube type: Oral Tube size: 7.0 mm Number of attempts: 1 Airway Equipment and Method: Stylet Placement Confirmation: ETT inserted through vocal cords under direct vision,  positive ETCO2 and breath sounds checked- equal and bilateral Secured at: 21 cm Tube secured with: Tape Dental Injury: Teeth and Oropharynx as per pre-operative assessment

## 2018-08-26 NOTE — Op Note (Signed)
  08/26/2018  12:33 PM  PATIENT:  Crystal Crystal [name]  23 y.o. female  PRE-OPERATIVE DIAGNOSIS:  DESIRES STERILIZATION  POST-OPERATIVE DIAGNOSIS:  * No post-op diagnosis entered *  PROCEDURE:  Procedure(s) with comments: [place] (Bilateral) - WITH FILSHIE CLIPS  SURGEON:  Surgeon(s) and Role:    * Bobbye Charleston, MD - Primary  ANESTHESIA:   general  EBL:  Min  LOCAL MEDICATIONS USED:  OTHER Ropivicaine in pelvis  SPECIMEN:  No Specimen  DISPOSITION OF SPECIMEN:  N/Crystal [name]  COUNTS:  YES  TOURNIQUET:  * No tourniquets in log *  DICTATION: .Note written in EPIC  PLAN OF CARE: Discharge to home after PACU  PATIENT DISPOSITION:  PACU - hemodynamically stable.   Delay start of Pharmacological VTE agent (>24hrs) due to surgical blood loss or risk of bleeding: not applicable    Complications:  none  Findings: normal tubes, uterus and ovaries  Technique  After adequate general endotracheal anesthesia was achieved, the patient was prepped and draped in the usual sterile fashion.  Crystal [name] 2 cm incision was made just below the umbilicus and the abdominal wall tented up.  The Veress needle was inserted at Crystal [name] 45 degree angle to the pelvis and no bowel contents or blood were aspirated.  The abdomen was insufflated and the 11 mm trocar placed without complication.  The operative scope was introduced and the above findings noted.  The filchie clip instrument was introduced and the tube were followed to their fimbriated ends bilaterally.  Crystal [name] filchie clip was placed on the isthmic portion of each tube and confirmed visually to be around the entire circumference of each tube.  After Crystal [name] quick inspection of the pelvis and liver edge, all instruments were removed and the abdomen was desufflated.  The 11 mm faschial incision was closed with Crystal [name] figure of eight stitch of 2-vicryl and the skin was closed with dermabond.  All instruments were removed from the vagina and the patient returned to  the PACU in stable condition.  Crystal Crystal [name]

## 2018-08-26 NOTE — Anesthesia Preprocedure Evaluation (Addendum)
Anesthesia Evaluation  Patient identified by MRN, date of birth, ID band Patient awake    Reviewed: Allergy & Precautions, NPO status , Patient's Chart, lab work & pertinent test results  History of Anesthesia Complications Negative for: history of anesthetic complications  Airway Mallampati: II  TM Distance: >3 FB Neck ROM: Full    Dental no notable dental hx. (+) Edentulous Upper, Dental Advisory Given,    Pulmonary Current Smoker,    Pulmonary exam normal        Cardiovascular negative cardio ROS Normal cardiovascular exam     Neuro/Psych negative neurological ROS     GI/Hepatic negative GI ROS, Neg liver ROS,   Endo/Other  negative endocrine ROS  Renal/GU negative Renal ROS     Musculoskeletal negative musculoskeletal ROS (+)   Abdominal   Peds  Hematology negative hematology ROS (+)   Anesthesia Other Findings Day of surgery medications reviewed with the patient.  Reproductive/Obstetrics                          Anesthesia Physical Anesthesia Plan  ASA: I  Anesthesia Plan: General   Post-op Pain Management:    Induction: Intravenous  PONV Risk Score and Plan: 4 or greater and Treatment may vary due to age or medical condition, Ondansetron, Dexamethasone, Midazolam and Scopolamine patch - Pre-op  Airway Management Planned: Oral ETT  Additional Equipment:   Intra-op Plan:   Post-operative Plan: Extubation in OR  Informed Consent: I have reviewed the patients History and Physical, chart, labs and discussed the procedure including the risks, benefits and alternatives for the proposed anesthesia with the patient or authorized representative who has indicated his/her understanding and acceptance.     Dental advisory given  Plan Discussed with: CRNA  Anesthesia Plan Comments:        Anesthesia Quick Evaluation

## 2018-08-26 NOTE — Anesthesia Postprocedure Evaluation (Signed)
Anesthesia Post Note  Patient: Crystal [name]  Procedure(s) Performed: LAPAROSCOPIC TUBAL LIGATION (Bilateral Abdomen)     Patient location during evaluation: PACU Anesthesia Type: General Level of consciousness: awake and alert Pain management: pain level controlled Vital Signs Assessment: post-procedure vital signs reviewed and stable Respiratory status: spontaneous breathing, nonlabored ventilation, respiratory function stable and patient connected to nasal cannula oxygen Cardiovascular status: blood pressure returned to baseline and stable Postop Assessment: no apparent nausea or vomiting Anesthetic complications: no    Last Vitals:  Vitals:   08/26/18 1330 08/26/18 1345  BP: (!) 100/57   Pulse: (!) 50 (!) 49  Resp: 12 13  Temp:    SpO2: 97% 100%    Last Pain:  Vitals:   08/26/18 1345  TempSrc:   PainSc: 5                  Barnet Glasgow

## 2018-08-27 ENCOUNTER — Encounter (HOSPITAL_BASED_OUTPATIENT_CLINIC_OR_DEPARTMENT_OTHER): Payer: Self-pay | Admitting: Obstetrics and Gynecology

## 2018-08-27 MED FILL — Ropivacaine HCl Inj 5 MG/ML: INTRAMUSCULAR | Qty: 30 | Status: AC

## 2019-03-04 IMAGING — US US OB < 14 WEEKS - US OB TV
1 series · 15 of 28 positions shown · non-contrast
Comparison: None.

CLINICAL DATA: Status post recent failed pregnancy for 2 weeks with
continued vaginal bleeding

EXAM:
OBSTETRIC <14 WK US AND TRANSVAGINAL OB US
TECHNIQUE: Both transabdominal and transvaginal ultrasound examinations were
performed for complete evaluation of the gestation as well as the
maternal uterus, adnexal regions, and pelvic cul-de-sac.
Transvaginal technique was performed to assess early pregnancy.

[Series 1: us ob < 14 weeks - us ob tv · 15 of 52 slices shown]
[im 1/52]
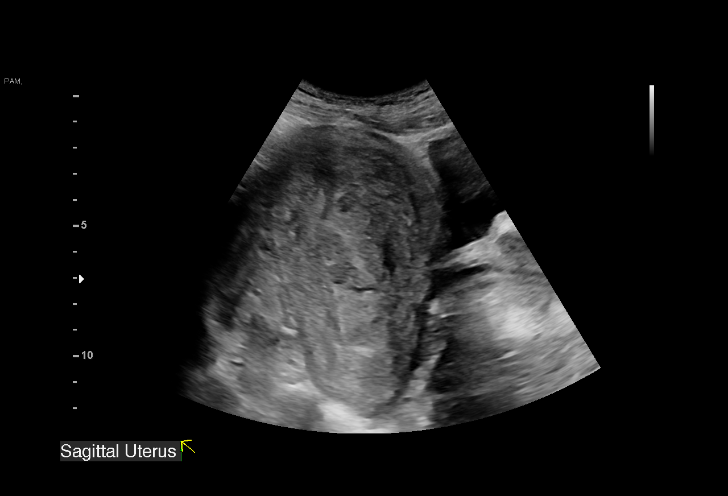
[im 4/52]
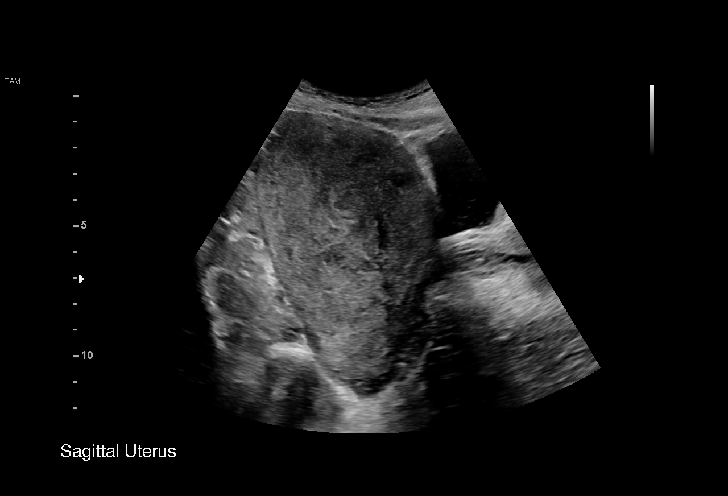
[im 8/52]
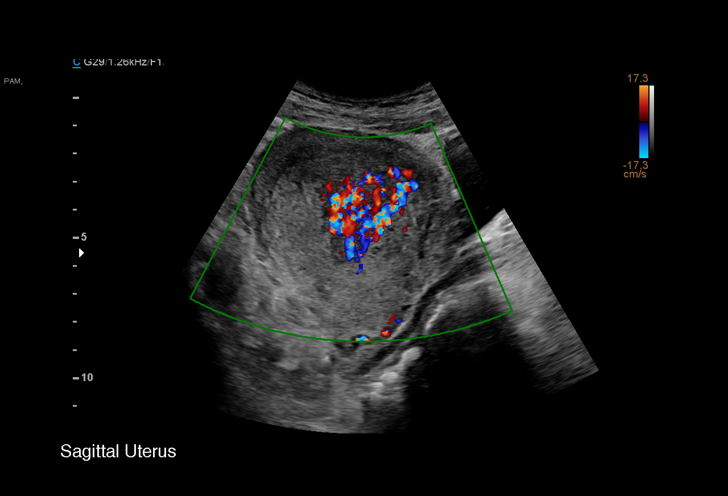
[im 12/52]
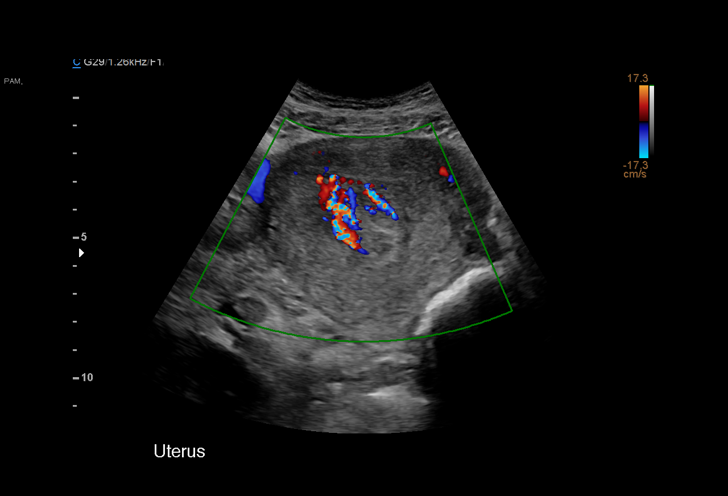
[im 16/52]
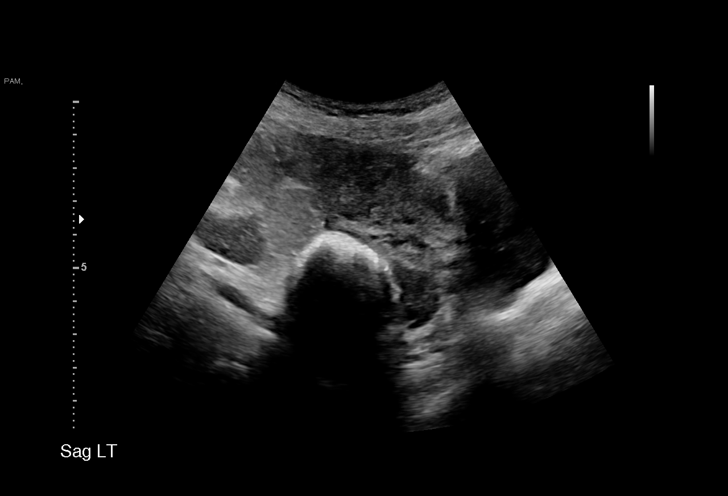
[im 19/52]
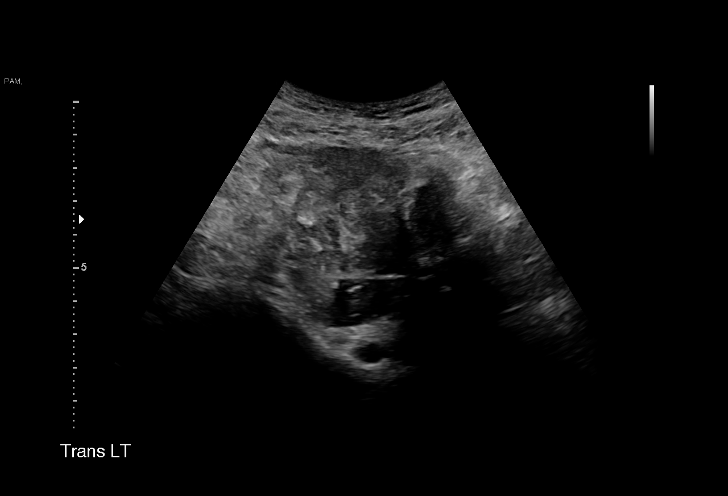
[im 23/52]
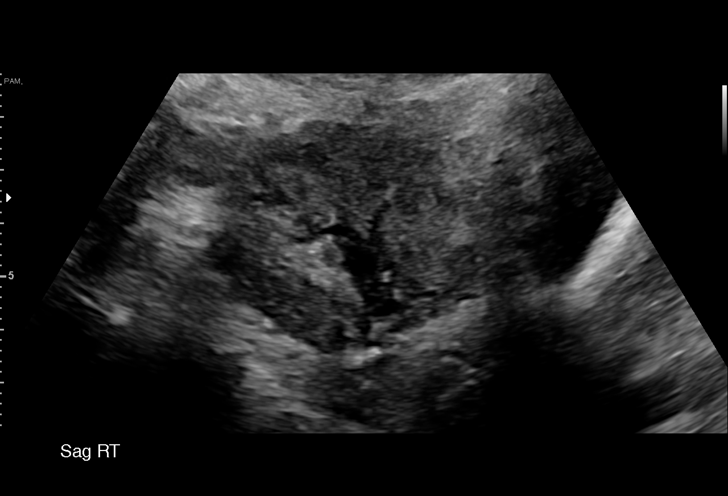
[im 27/52]
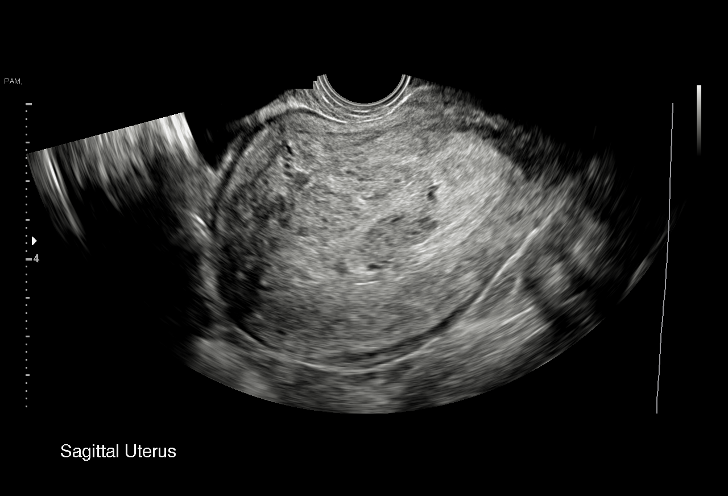
[im 29/52]
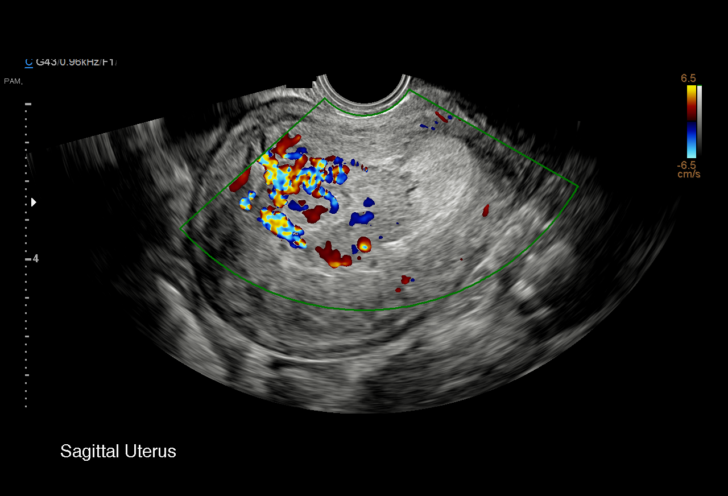
[im 33/52]
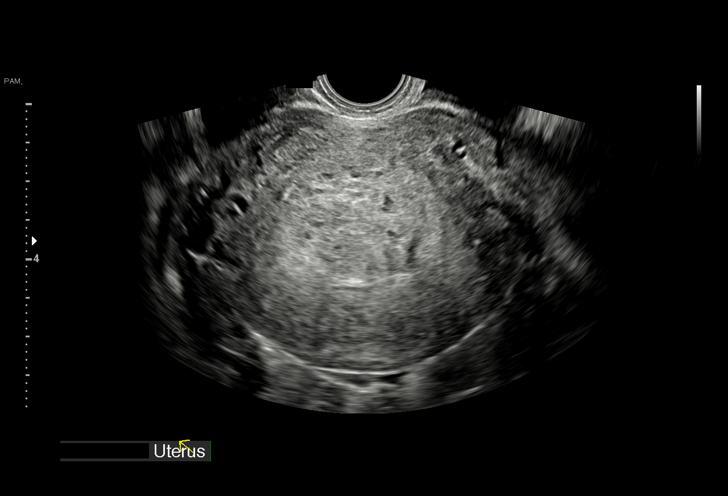
[im 36/52]
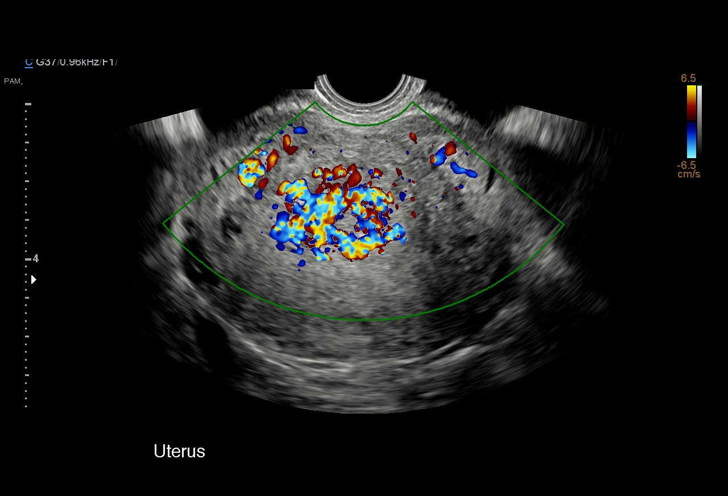
[im 40/52]
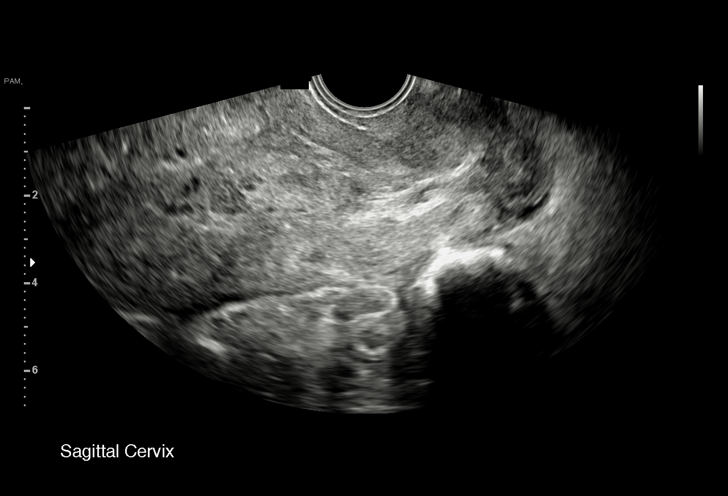
[im 44/52]
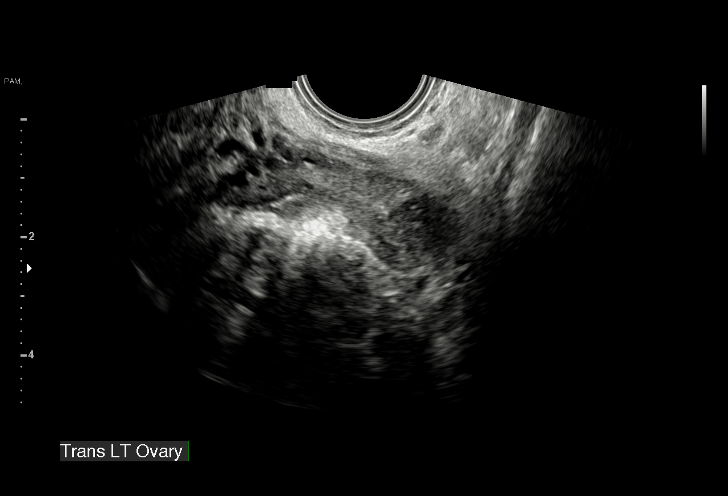
[im 48/52]
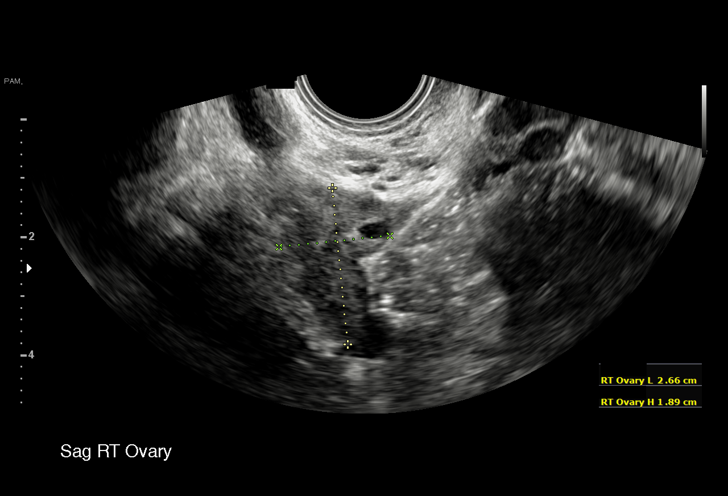
[im 52/52]
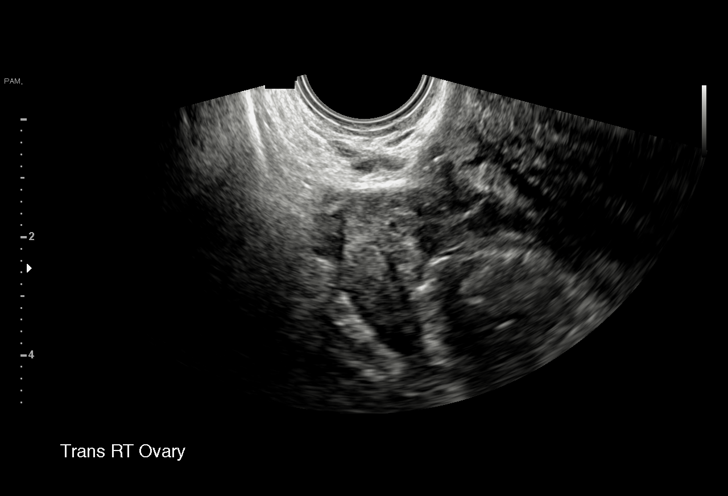

[15 of 28 positions shown; findings below may reference images not displayed]

FINDINGS: Intrauterine gestational sac: Not seen

Yolk sac:  Not seen

Embryo:  Not seen

Maternal uterus/adnexae: Bilateral ovaries are within normal limits.
Right ovary measures 2 by 2.7 x 1.9 cm. Left ovary measures 2.7 by
2.4 x 1.5 cm. No significant free fluid.

Endometrial mass or heterogeneous echogenic thickening measuring 6 x
3.5 cm with hypervascularity.
IMPRESSION: 1. No intrauterine pregnancy is visualized. By report, patient is
status post TAB.
2. Heterogeneous masslike thickening of the endometrium in the
fundus measuring up to 6 cm in thickness with increased vascularity,
findings would be consistent with retained products of conception in
the appropriate clinical setting.

## 2020-01-11 IMAGING — US US MFM OB LIMITED
1 series · 15 of 22 positions shown · non-contrast
Comparison: none

[Series 1: us mfm ob limited · 15 of 22 slices shown]
[im 1/22]
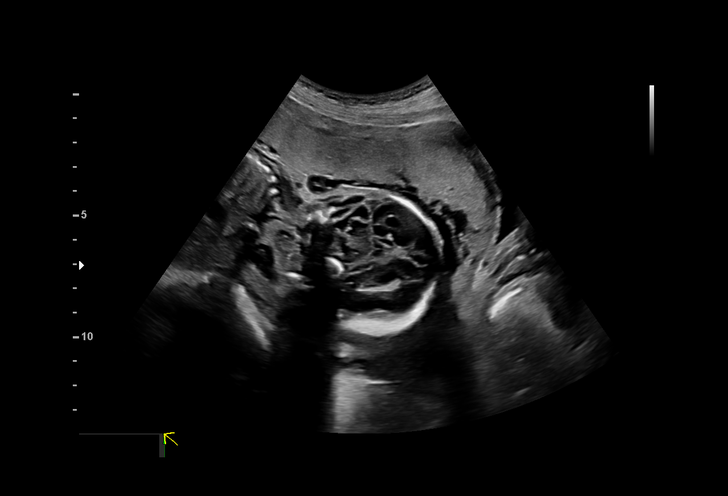
[im 3/22]
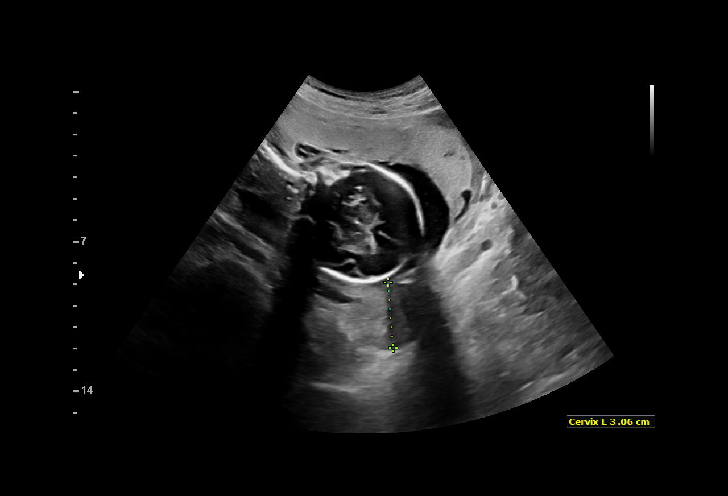
[im 4/22]
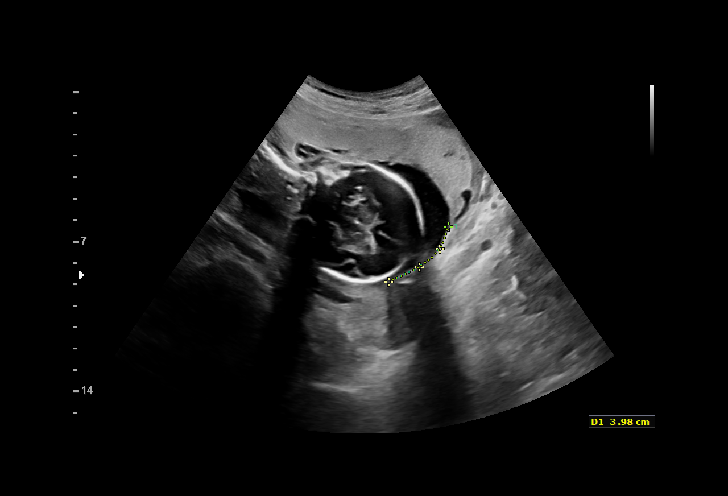
[im 6/22]
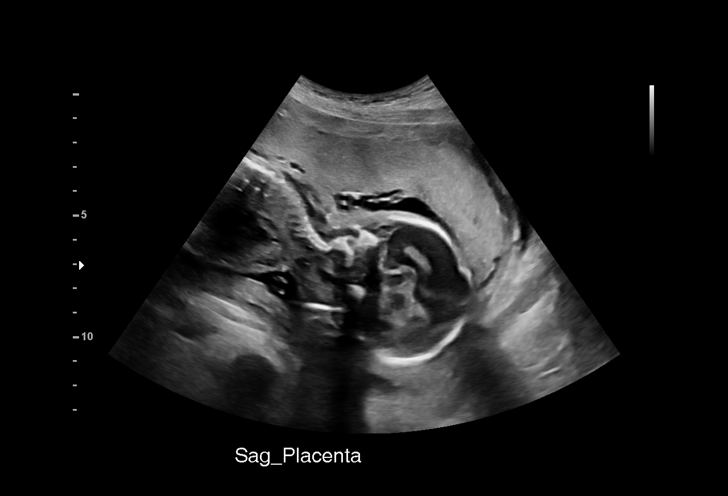
[im 7/22]
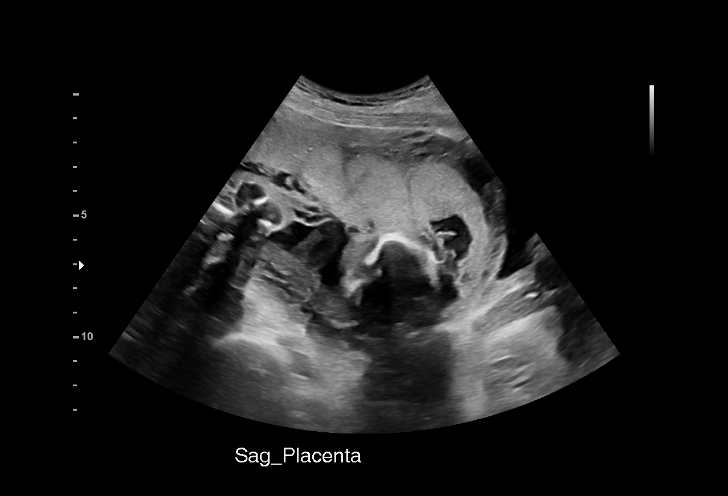
[im 9/22]
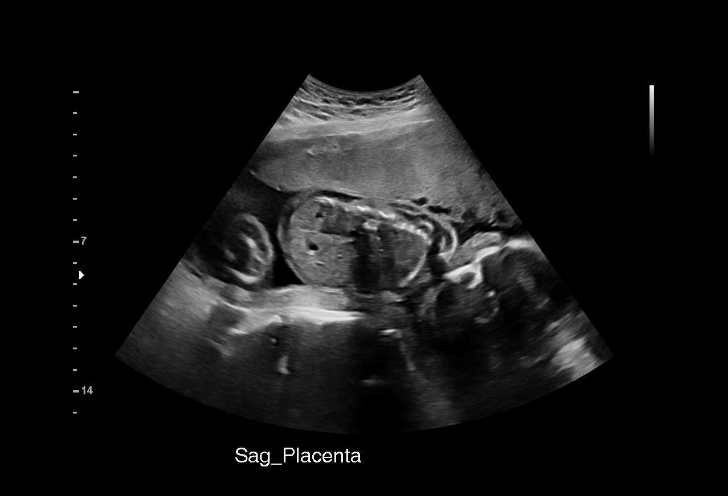
[im 10/22]
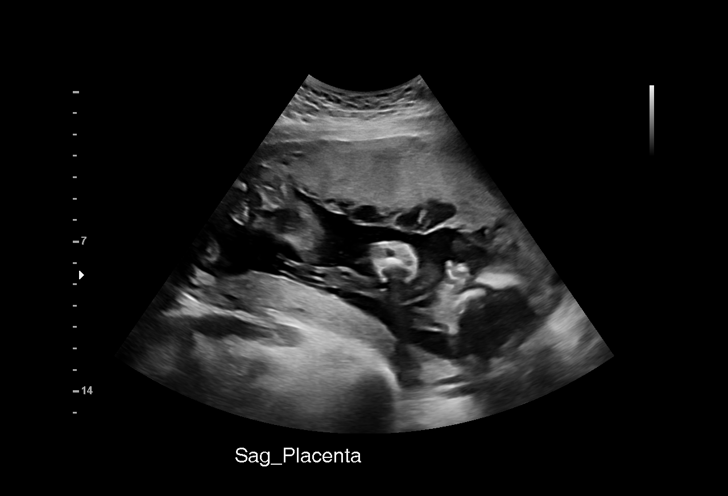
[im 12/22]
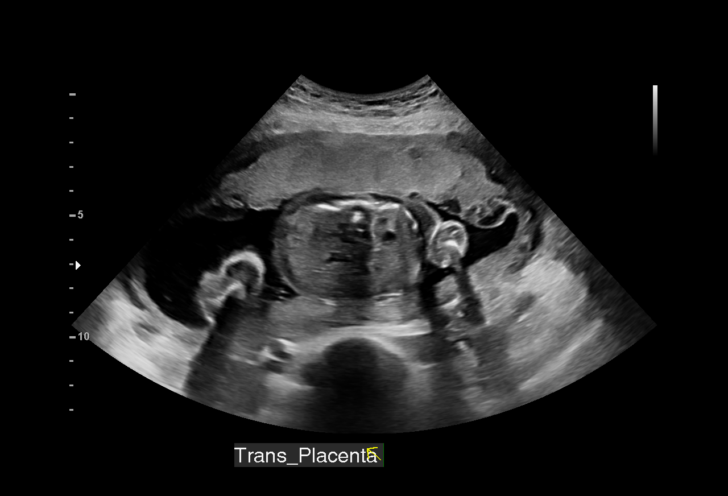
[im 13/22]
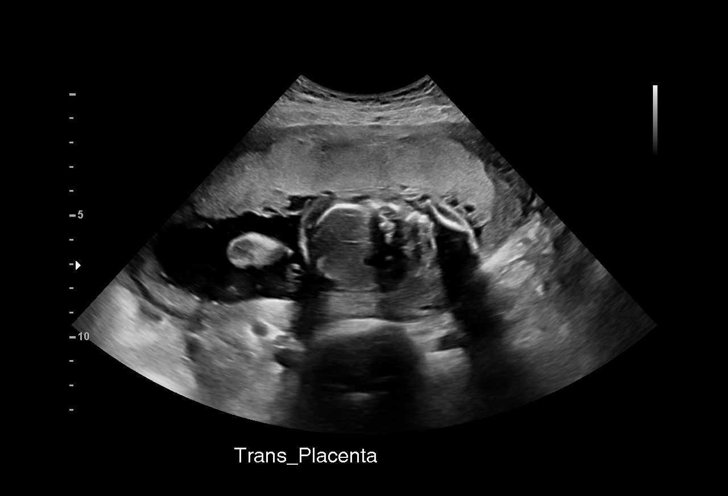
[im 14/22]
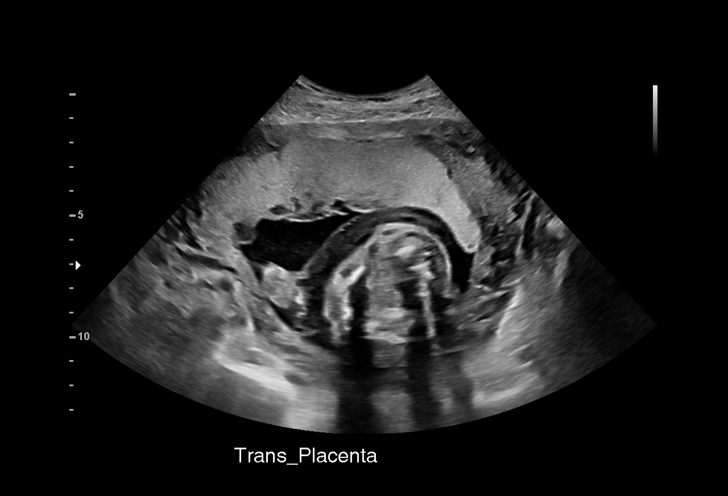
[im 16/22]
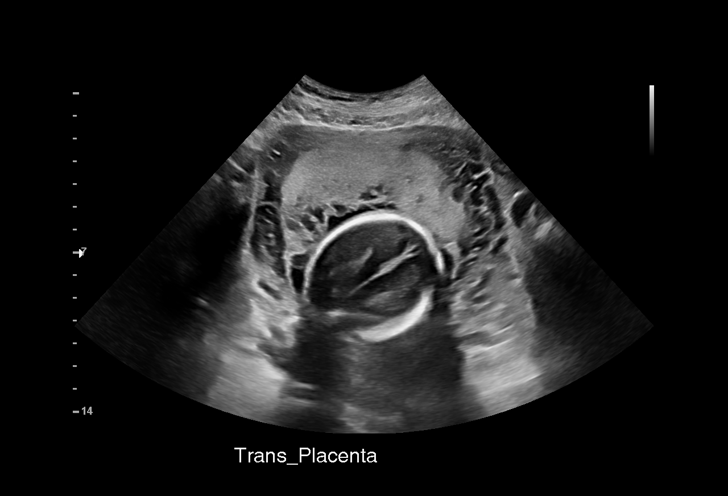
[im 17/22]
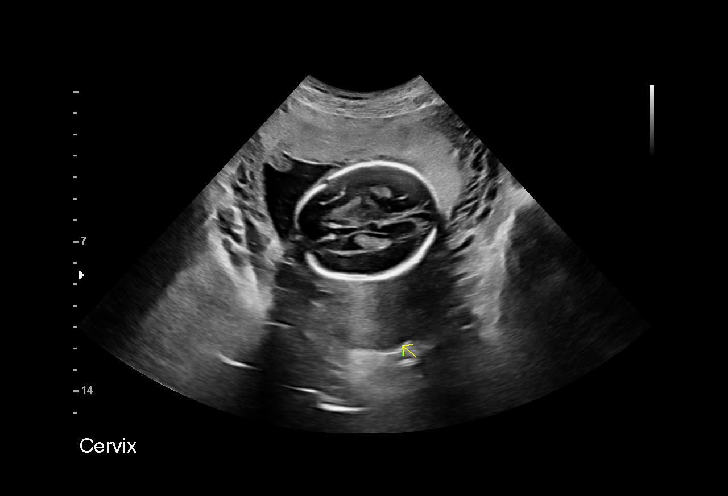
[im 19/22]
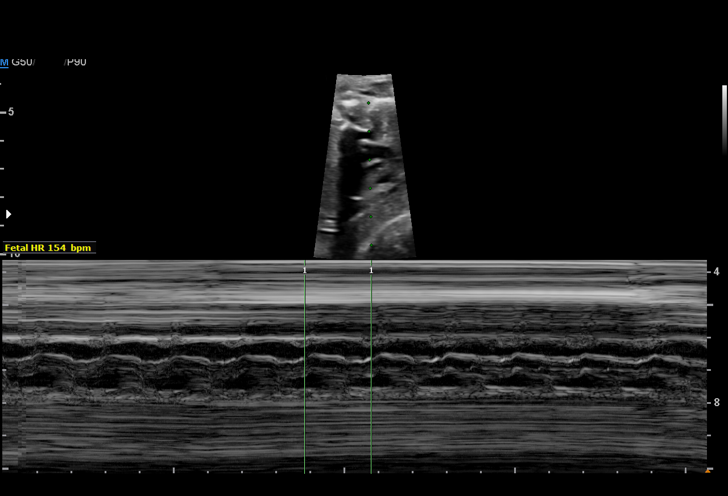
[im 20/22]
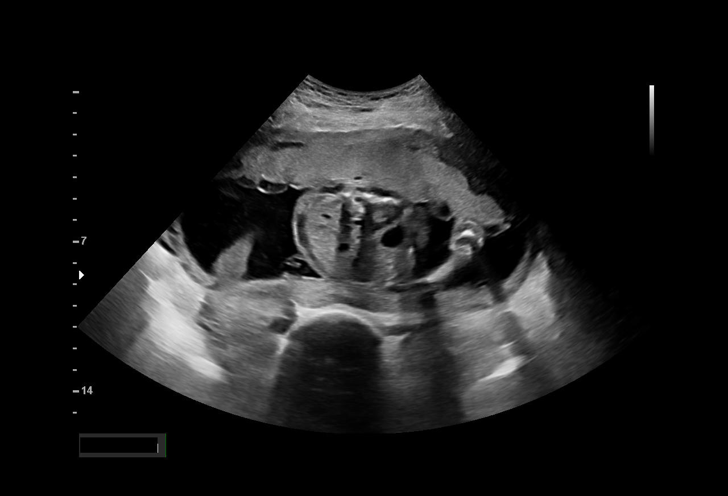
[im 22/22]
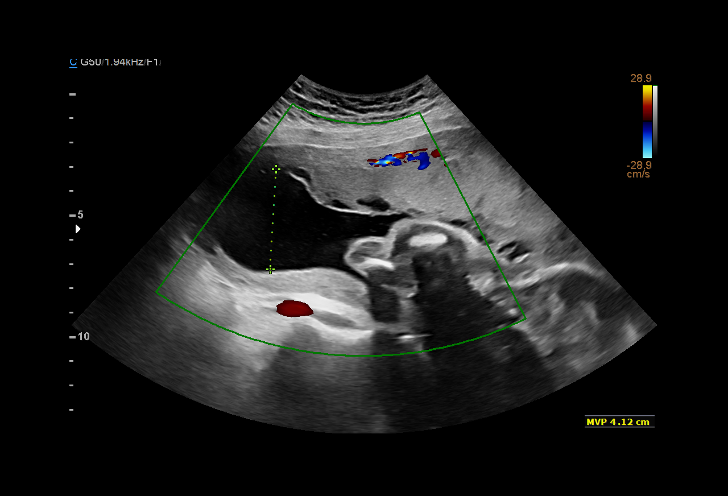

[15 of 22 positions shown; findings below may reference images not displayed]

----------------------------------------------------------------------

 ----------------------------------------------------------------------
Indications

  Placenta previa specified as without
  hemorrhage, second trimester (Complete
  previa per pt, not seen today)
  22 weeks gestation of pregnancy
  Vaginal bleeding in pregnancy, second
  trimester
 ----------------------------------------------------------------------
Vital Signs

 BMI:
Fetal Evaluation

 Num Of Fetuses:          1
 Fetal Heart Rate(bpm):   154
 Cardiac Activity:        Observed
 Presentation:            Cephalic
 Placenta:                No abruption or previa seen, Anterior

 Amniotic Fluid
 AFI FV:      Within normal limits

                             Largest Pocket(cm)

OB History

 Gravidity:    4         Term:   2
 TOP:          1        Living:  2
Gestational Age

 Clinical EDD:  22w 1d                                        EDD:   07/09/18
 Best:          22w 1d     Det. By:  Clinical EDD             EDD:   07/09/18
Cervix Uterus Adnexa

 Cervix
 Length:           3.06  cm.
 Normal appearance by transabdominal scan.
Impression

 Placenta previa on outside exam.
 No placenta previa or placental abruption observed today
Recommendations

 Follow up as clinically indicated.
